# Patient Record
Sex: Female | Born: 1990 | Race: Black or African American | Hispanic: No | Marital: Single | Smoking: Former smoker
Health system: Southern US, Community
[De-identification: ages and names within clinical notes are randomized; demographics above are authoritative.]

## PROBLEM LIST (undated history)

## (undated) DIAGNOSIS — O09219 Supervision of pregnancy with history of pre-term labor, unspecified trimester: Secondary | ICD-10-CM

## (undated) HISTORY — DX: Supervision of pregnancy with history of pre-term labor, unspecified trimester: O09.219

## (undated) HISTORY — PX: NO PAST SURGERIES: SHX2092

---

## 2015-04-14 DIAGNOSIS — Z975 Presence of (intrauterine) contraceptive device: Secondary | ICD-10-CM | POA: Insufficient documentation

## 2015-04-14 DIAGNOSIS — O42111 Preterm premature rupture of membranes, onset of labor more than 24 hours following rupture, first trimester: Secondary | ICD-10-CM

## 2015-04-14 DIAGNOSIS — O09899 Supervision of other high risk pregnancies, unspecified trimester: Secondary | ICD-10-CM

## 2015-04-14 HISTORY — DX: Supervision of other high risk pregnancies, unspecified trimester: O09.899

## 2017-03-03 NOTE — L&D Delivery Note (Signed)
Patient: Debra Malone MRN: [id number]  GBS status: negative  Patient is a 27 y.o. now [id number] s/p NSVD at 7461w3d, who was admitted for preterm labor, s/p BMZ x1 yesterday at 12:20. Progressed on her own. SROM 0h 7330m prior to delivery with clear fluid.   Delivery Note At 10:28 AM a viable female was delivered via Vaginal, Spontaneous (Presentation: vertex; LOA  ).  APGAR: 9, 9; weight pending  Placenta status: intact, bilobed.  Cord:  3-vessel with the following complications: loose nuchal cord  Anesthesia:  Epidural Episiotomy: None Lacerations: Labial Suture Repair: 4.0 Monocryl Est. Blood Loss (mL):    Mom to postpartum.  Baby to Couplet care / Skin to Skin.  Raynelle FanningJulie P. Degele, MD OB Fellow 08/03/17, 10:48 AM

## 2017-03-30 LAB — OB RESULTS CONSOLE RUBELLA ANTIBODY, IGM: Rubella: IMMUNE

## 2017-03-30 LAB — OB RESULTS CONSOLE HEPATITIS B SURFACE ANTIGEN: Hepatitis B Surface Ag: NEGATIVE

## 2017-04-08 ENCOUNTER — Encounter: Payer: Self-pay | Admitting: Obstetrics & Gynecology

## 2017-04-08 ENCOUNTER — Ambulatory Visit (INDEPENDENT_AMBULATORY_CARE_PROVIDER_SITE_OTHER): Payer: Medicaid Other | Admitting: Obstetrics & Gynecology

## 2017-04-08 DIAGNOSIS — Z3482 Encounter for supervision of other normal pregnancy, second trimester: Secondary | ICD-10-CM

## 2017-04-08 DIAGNOSIS — Z3481 Encounter for supervision of other normal pregnancy, first trimester: Secondary | ICD-10-CM | POA: Diagnosis not present

## 2017-04-08 DIAGNOSIS — O09219 Supervision of pregnancy with history of pre-term labor, unspecified trimester: Secondary | ICD-10-CM

## 2017-04-08 DIAGNOSIS — Z3490 Encounter for supervision of normal pregnancy, unspecified, unspecified trimester: Secondary | ICD-10-CM

## 2017-04-08 DIAGNOSIS — O0992 Supervision of high risk pregnancy, unspecified, second trimester: Secondary | ICD-10-CM

## 2017-04-08 DIAGNOSIS — O09899 Supervision of other high risk pregnancies, unspecified trimester: Secondary | ICD-10-CM | POA: Insufficient documentation

## 2017-04-08 DIAGNOSIS — O09212 Supervision of pregnancy with history of pre-term labor, second trimester: Secondary | ICD-10-CM

## 2017-04-08 DIAGNOSIS — O0990 Supervision of high risk pregnancy, unspecified, unspecified trimester: Secondary | ICD-10-CM | POA: Insufficient documentation

## 2017-04-08 MED ORDER — HYDROXYPROGESTERONE CAPROATE 275 MG/1.1ML ~~LOC~~ SOAJ
275.0000 mg | Freq: Once | SUBCUTANEOUS | Status: AC
  Administered 2017-04-13: 275 mg via SUBCUTANEOUS

## 2017-04-08 NOTE — Progress Notes (Signed)
  [facility]    Debra Dora SimsM Faulconer is a [id number] 4258w5d being seen today for her first obstetrical visit.  Her obstetrical history is significant for h/o 21 week loss and 30 week PTB. Patient does intend to breast feed. Pregnancy history fully reviewed.  Patient reports no complaints.  Vitals:   04/08/17 1310 04/08/17 1311  BP: 117/72   Pulse: 85   Weight: 143 lb 11.2 oz (65.2 kg)   Height:  5\' 4"  (1.626 m)    HISTORY: OB History  Gravida Para Term Preterm AB Living  4 2   2 1 1   SAB TAB Ectopic Multiple Live Births  1       2    # Outcome Date GA Lbr Len/2nd Weight Sex Delivery Anes PTL Lv  4 Current           3 Preterm 04/14/15 1270w0d    Vag-Spont   LIV  2 Preterm 07/22/13     Vag-Spont   ND  1 SAB              Past Medical History:  Diagnosis Date  . History of preterm delivery, currently pregnant 04/14/2015   30 weeks   History reviewed. No pertinent surgical history. History reviewed. No pertinent family history.   Exam    Uterus:   21  Pelvic Exam:                                    Skin: normal coloration and turgor, no rashes    Neurologic: oriented, normal mood   Extremities: normal strength, tone, and muscle mass   HEENT PERRLA and neck supple with midline trachea   Mouth/Teeth dental hygiene good   Neck supple   Cardiovascular: regular rate and rhythm   Respiratory:  appears well, vitals normal, no respiratory distress, acyanotic, normal RR   Abdomen: gravid NT   Urinary:        Assessment:    Pregnancy: [id number] Patient Active Problem List   Diagnosis Date Noted  . Supervision of high risk pregnancy, antepartum 04/08/2017  . History of preterm delivery, currently pregnant 04/08/2017        Plan:     Initial labs drawn. Prenatal vitamins. Problem list reviewed and updated. Genetic Screening discussed  MaterniT21 normal  Ultrasound discussed; fetal survey: ordered.  Follow up in 4 weeks. 50% of 30 min  visit spent on counseling and coordination of care.  Continue Makena injection weekly   Scheryl DarterJames Omero Kowal 04/08/2017

## 2017-04-08 NOTE — Patient Instructions (Addendum)
Second Trimester of Pregnancy The second trimester is from week 13 through week 28, month 4 through 6. This is often the time in pregnancy that you feel your best. Often times, morning sickness has lessened or quit. You may have more energy, and you may get hungry more often. Your unborn baby (fetus) is growing rapidly. At the end of the sixth month, he or she is about 9 inches long and weighs about 1 pounds. You will likely feel the baby move (quickening) between 18 and 20 weeks of pregnancy. Follow these instructions at home:  Avoid all smoking, herbs, and alcohol. Avoid drugs not approved by your doctor.  Do not use any tobacco products, including cigarettes, chewing tobacco, and electronic cigarettes. If you need help quitting, ask your doctor. You may get counseling or other support to help you quit.  Only take medicine as told by your doctor. Some medicines are safe and some are not during pregnancy.  Exercise only as told by your doctor. Stop exercising if you start having cramps.  Eat regular, healthy meals.  Wear a good support bra if your breasts are tender.  Do not use hot tubs, steam rooms, or saunas.  Wear your seat belt when driving.  Avoid raw meat, uncooked cheese, and liter boxes and soil used by cats.  Take your prenatal vitamins.  Take 1500-2000 milligrams of calcium daily starting at the 20th week of pregnancy until you deliver your baby.  Try taking medicine that helps you poop (stool softener) as needed, and if your doctor approves. Eat more fiber by eating fresh fruit, vegetables, and whole grains. Drink enough fluids to keep your pee (urine) clear or pale yellow.  Take warm water baths (sitz baths) to soothe pain or discomfort caused by hemorrhoids. Use hemorrhoid cream if your doctor approves.  If you have puffy, bulging veins (varicose veins), wear support hose. Raise (elevate) your feet for 15 minutes, 3-4 times a day. Limit salt in your diet.  Avoid heavy  lifting, wear low heals, and sit up straight.  Rest with your legs raised if you have leg cramps or low back pain.  Visit your dentist if you have not gone during your pregnancy. Use a soft toothbrush to brush your teeth. Be gentle when you floss.  You can have sex (intercourse) unless your doctor tells you not to.  Go to your doctor visits. Get help if:  You feel dizzy.  You have mild cramps or pressure in your lower belly (abdomen).  You have a nagging pain in your belly area.  You continue to feel sick to your stomach (nauseous), throw up (vomit), or have watery poop (diarrhea).  You have bad smelling fluid coming from your vagina.  You have pain with peeing (urination). Get help right away if:  You have a fever.  You are leaking fluid from your vagina.  You have spotting or bleeding from your vagina.  You have severe belly cramping or pain.  You lose or gain weight rapidly.  You have trouble catching your breath and have chest pain.  You notice sudden or extreme puffiness (swelling) of your face, hands, ankles, feet, or legs.  You have not felt the baby move in over an hour.  You have severe headaches that do not go away with medicine.  You have vision changes. This information is not intended to replace advice given to you by your health care provider. Make sure you discuss any questions you have with your health care   provider. Document Released: 05/14/2009 Document Revised: 07/26/2015 Document Reviewed: 04/20/2012 Elsevier Interactive Patient Education  2017 Elsevier Inc.  

## 2017-04-10 ENCOUNTER — Other Ambulatory Visit: Payer: Self-pay | Admitting: Obstetrics & Gynecology

## 2017-04-10 ENCOUNTER — Ambulatory Visit (HOSPITAL_COMMUNITY)
Admission: RE | Admit: 2017-04-10 | Discharge: 2017-04-10 | Disposition: A | Discharge status: Home / Self Care | Payer: Medicaid Other | Source: Ambulatory Visit | Attending: Obstetrics & Gynecology | Admitting: Obstetrics & Gynecology

## 2017-04-10 ENCOUNTER — Other Ambulatory Visit (HOSPITAL_COMMUNITY): Payer: Self-pay | Admitting: *Deleted

## 2017-04-10 ENCOUNTER — Encounter (HOSPITAL_COMMUNITY): Payer: Self-pay

## 2017-04-10 DIAGNOSIS — O09219 Supervision of pregnancy with history of pre-term labor, unspecified trimester: Secondary | ICD-10-CM | POA: Insufficient documentation

## 2017-04-10 DIAGNOSIS — O09899 Supervision of other high risk pregnancies, unspecified trimester: Secondary | ICD-10-CM

## 2017-04-10 DIAGNOSIS — Z3689 Encounter for other specified antenatal screening: Secondary | ICD-10-CM

## 2017-04-10 DIAGNOSIS — Z3A20 20 weeks gestation of pregnancy: Secondary | ICD-10-CM

## 2017-04-10 DIAGNOSIS — Z3490 Encounter for supervision of normal pregnancy, unspecified, unspecified trimester: Secondary | ICD-10-CM

## 2017-04-10 DIAGNOSIS — O0990 Supervision of high risk pregnancy, unspecified, unspecified trimester: Secondary | ICD-10-CM

## 2017-04-10 DIAGNOSIS — O09299 Supervision of pregnancy with other poor reproductive or obstetric history, unspecified trimester: Secondary | ICD-10-CM | POA: Diagnosis not present

## 2017-04-13 ENCOUNTER — Ambulatory Visit: Payer: Medicaid Other

## 2017-04-13 ENCOUNTER — Encounter: Payer: Self-pay | Admitting: Obstetrics & Gynecology

## 2017-04-13 VITALS — BP 124/75 | HR 79

## 2017-04-13 DIAGNOSIS — Z8751 Personal history of pre-term labor: Secondary | ICD-10-CM

## 2017-04-13 NOTE — Progress Notes (Signed)
Pt is in the office for 17-p injection, administered and pt tolerated well .Marland Kitchen. Administrations This Visit    HYDROXYprogesterone Caproate SOAJ 275 mg    Admin Date 04/13/2017 Action Given Dose 275 mg Route Subcutaneous Administered By Katrina StackStalling, Brittany D, RN

## 2017-04-15 ENCOUNTER — Encounter: Payer: Self-pay | Admitting: Family Medicine

## 2017-04-15 DIAGNOSIS — O0990 Supervision of high risk pregnancy, unspecified, unspecified trimester: Secondary | ICD-10-CM

## 2017-04-20 ENCOUNTER — Ambulatory Visit (INDEPENDENT_AMBULATORY_CARE_PROVIDER_SITE_OTHER): Payer: Medicaid Other | Admitting: Pediatrics

## 2017-04-20 VITALS — BP 111/73 | HR 91 | Wt 143.0 lb

## 2017-04-20 DIAGNOSIS — O09212 Supervision of pregnancy with history of pre-term labor, second trimester: Secondary | ICD-10-CM

## 2017-04-20 DIAGNOSIS — Z8751 Personal history of pre-term labor: Secondary | ICD-10-CM

## 2017-04-20 MED ORDER — HYDROXYPROGESTERONE CAPROATE 275 MG/1.1ML ~~LOC~~ SOAJ
275.0000 mg | Freq: Once | SUBCUTANEOUS | Status: AC
  Administered 2017-04-20: 275 mg via SUBCUTANEOUS

## 2017-04-20 NOTE — Progress Notes (Signed)
I have reviewed this chart and agree with the RN/CMA assessment and management.    K. Meryl Davis, M.D. Attending Obstetrician & Gynecologist, [facility], [facility]  

## 2017-04-27 ENCOUNTER — Other Ambulatory Visit (HOSPITAL_COMMUNITY)
Admission: RE | Admit: 2017-04-27 | Discharge: 2017-04-27 | Disposition: A | Discharge status: Home / Self Care | Payer: Medicaid Other | Source: Ambulatory Visit | Attending: Obstetrics | Admitting: Obstetrics

## 2017-04-27 ENCOUNTER — Ambulatory Visit: Payer: Medicaid Other

## 2017-04-27 DIAGNOSIS — N898 Other specified noninflammatory disorders of vagina: Secondary | ICD-10-CM

## 2017-04-27 DIAGNOSIS — O26899 Other specified pregnancy related conditions, unspecified trimester: Secondary | ICD-10-CM | POA: Insufficient documentation

## 2017-04-27 DIAGNOSIS — O09899 Supervision of other high risk pregnancies, unspecified trimester: Secondary | ICD-10-CM

## 2017-04-27 DIAGNOSIS — O09219 Supervision of pregnancy with history of pre-term labor, unspecified trimester: Principal | ICD-10-CM

## 2017-04-27 MED ORDER — HYDROXYPROGESTERONE CAPROATE 275 MG/1.1ML ~~LOC~~ SOAJ
275.0000 mg | Freq: Once | SUBCUTANEOUS | Status: AC
  Administered 2017-04-27: 275 mg via SUBCUTANEOUS

## 2017-04-27 NOTE — Progress Notes (Signed)
17 P given in Left Deltoid w/o any problems  Pt instructed how to self swab

## 2017-04-27 NOTE — Progress Notes (Signed)
I have reviewed the chart and agree with nursing staff's documentation of this patient's encounter.  Catalina AntiguaPeggy Elmire Amrein, MD 04/27/2017 2:43 PM

## 2017-04-28 LAB — CERVICOVAGINAL ANCILLARY ONLY
Bacterial vaginitis: NEGATIVE
Candida vaginitis: POSITIVE — AB
Chlamydia: NEGATIVE
Neisseria Gonorrhea: NEGATIVE
Trichomonas: NEGATIVE

## 2017-04-29 ENCOUNTER — Telehealth: Payer: Self-pay

## 2017-04-29 ENCOUNTER — Other Ambulatory Visit: Payer: Self-pay | Admitting: Obstetrics and Gynecology

## 2017-04-29 MED ORDER — TERCONAZOLE 0.8 % VA CREA: 1.0000 | TOPICAL_CREAM | Freq: Every day | VAGINAL | 0 refills | Status: DC

## 2017-04-29 NOTE — Telephone Encounter (Signed)
Patient notified of results and Rx; also encouraged her to sign up for MyChart. I resent Activation code.

## 2017-04-29 NOTE — Telephone Encounter (Signed)
-----   Message from Catalina AntiguaPeggy Constant, MD sent at 04/29/2017  8:35 AM EST ----- Please inform patient of yeast infection. Rx has been provided

## 2017-05-04 ENCOUNTER — Encounter: Payer: Self-pay | Admitting: Obstetrics and Gynecology

## 2017-05-04 ENCOUNTER — Ambulatory Visit (INDEPENDENT_AMBULATORY_CARE_PROVIDER_SITE_OTHER): Payer: Medicaid Other | Admitting: Obstetrics and Gynecology

## 2017-05-04 VITALS — BP 129/72 | HR 98 | Wt 144.5 lb

## 2017-05-04 DIAGNOSIS — O0992 Supervision of high risk pregnancy, unspecified, second trimester: Secondary | ICD-10-CM

## 2017-05-04 DIAGNOSIS — O09212 Supervision of pregnancy with history of pre-term labor, second trimester: Secondary | ICD-10-CM | POA: Diagnosis not present

## 2017-05-04 DIAGNOSIS — O0990 Supervision of high risk pregnancy, unspecified, unspecified trimester: Secondary | ICD-10-CM

## 2017-05-04 DIAGNOSIS — O09219 Supervision of pregnancy with history of pre-term labor, unspecified trimester: Secondary | ICD-10-CM

## 2017-05-04 DIAGNOSIS — Z87898 Personal history of other specified conditions: Secondary | ICD-10-CM

## 2017-05-04 DIAGNOSIS — F1911 Other psychoactive substance abuse, in remission: Secondary | ICD-10-CM

## 2017-05-04 DIAGNOSIS — O09899 Supervision of other high risk pregnancies, unspecified trimester: Secondary | ICD-10-CM

## 2017-05-04 MED ORDER — HYDROXYPROGESTERONE CAPROATE 275 MG/1.1ML ~~LOC~~ SOAJ
275.0000 mg | Freq: Once | SUBCUTANEOUS | Status: AC
  Administered 2017-05-04: 275 mg via SUBCUTANEOUS

## 2017-05-04 NOTE — Patient Instructions (Signed)
DTaP Vaccine (Diphtheria, Tetanus, and Pertussis): What You Need to Know 1. Why get vaccinated? Diphtheria, tetanus, and pertussis are serious diseases caused by bacteria. Diphtheria and pertussis are spread from person to person. Tetanus enters the body through cuts or wounds. DIPHTHERIA causes a thick covering in the back of the throat.  It can lead to breathing problems, paralysis, heart failure, and even death.  TETANUS (Lockjaw) causes painful tightening of the muscles, usually all over the body.  It can lead to "locking" of the jaw so the victim cannot open his mouth or swallow. Tetanus leads to death in up to 2 out of 10 cases.  PERTUSSIS (Whooping Cough) causes coughing spells so bad that it is hard for infants to eat, drink, or breathe. These spells can last for weeks.  It can lead to pneumonia, seizures (jerking and staring spells), brain damage, and death.  Diphtheria, tetanus, and pertussis vaccine (DTaP) can help prevent these diseases. Most children who are vaccinated with DTaP will be protected throughout childhood. Many more children would get these diseases if we stopped vaccinating. DTaP is a safer version of an older vaccine called DTP. DTP is no longer used in the [place]. 2. Who should get DTaP vaccine and when? Children should get 5 doses of DTaP vaccine, one dose at each of the following ages:  2 months  4 months  6 months  15-18 months  4-6 years  DTaP may be given at the same time as other vaccines. 3. Some children should not get DTaP vaccine or should wait  Children with minor illnesses, such as a cold, may be vaccinated. But children who are moderately or severely ill should usually wait until they recover before getting DTaP vaccine.  Any child who had a life-threatening allergic reaction after a dose of DTaP should not get another dose.  Any child who suffered a brain or nervous system disease within 7 days after a dose of DTaP should not get  another dose.  Talk with your doctor if your child: ? had a seizure or collapsed after a dose of DTaP, ? cried non-stop for 3 hours or more after a dose of DTaP, ? had a fever over 105F after a dose of DTaP. Ask your doctor for more information. Some of these children should not get another dose of pertussis vaccine, but may get a vaccine without pertussis, called DT. 4. Older children and adults DTaP is not licensed for adolescents, adults, or children 7 years of age and older. But older people still need protection. A vaccine called Tdap is similar to DTaP. A single dose of Tdap is recommended for people 11 through 25 years of age. Another vaccine, called Td, protects against tetanus and diphtheria, but not pertussis. It is recommended every 10 years. There are separate Vaccine Information Statements for these vaccines. 5. What are the risks from DTaP vaccine? Getting diphtheria, tetanus, or pertussis disease is much riskier than getting DTaP vaccine. However, a vaccine, like any medicine, is capable of causing serious problems, such as severe allergic reactions. The risk of DTaP vaccine causing serious harm, or death, is extremely small. Mild problems (common)  Fever (up to about 1 child in 4)  Redness or swelling where the shot was given (up to about 1 child in 4)  Soreness or tenderness where the shot was given (up to about 1 child in 4) These problems occur more often after the 4th and 5th doses of the DTaP series than after   earlier doses. Sometimes the 4th or 5th dose of DTaP vaccine is followed by swelling of the entire arm or leg in which the shot was given, lasting 1-7 days (up to about 1 child in 30). Other mild problems include:  Fussiness (up to about 1 child in 3)  Tiredness or poor appetite (up to about 1 child in 10)  Vomiting (up to about 1 child in 50) These problems generally occur 1-3 days after the shot. Moderate problems (uncommon)  Seizure (jerking or staring)  (about 1 child out of 14,000)  Non-stop crying, for 3 hours or more (up to about 1 child out of 1,000)  High fever, over 105F (about 1 child out of 16,000) Severe problems (very rare)  Serious allergic reaction (less than 1 out of a million doses)  Several other severe problems have been reported after DTaP vaccine. These include: ? Long-term seizures, coma, or lowered consciousness ? Permanent brain damage. These are so rare it is hard to tell if they are caused by the vaccine. Controlling fever is especially important for children who have had seizures, for any reason. It is also important if another family member has had seizures. You can reduce fever and pain by giving your child an aspirin-free pain reliever when the shot is given, and for the next 24 hours, following the package instructions. 6. What if there is a serious reaction? What should I look for? Look for anything that concerns you, such as signs of a severe allergic reaction, very high fever, or behavior changes. Signs of a severe allergic reaction can include hives, swelling of the face and throat, difficulty breathing, a fast heartbeat, dizziness, and weakness. These would start a few minutes to a few hours after the vaccination. What should I do?  If you think it is a severe allergic reaction or other emergency that can't wait, call 9-1-1 or get the person to the nearest hospital. Otherwise, call your doctor.  Afterward, the reaction should be reported to the Vaccine Adverse Event Reporting System (VAERS). Your doctor might file this report, or you can do it yourself through the VAERS web site at www.vaers.hhs.gov, or by calling 1-800-822-7967. ? VAERS is only for reporting reactions. They do not give medical advice. 7. The National Vaccine Injury Compensation Program The National Vaccine Injury Compensation Program (VICP) is a federal program that was created to compensate people who may have been injured by certain  vaccines. Persons who believe they may have been injured by a vaccine can learn about the program and about filing a claim by calling 1-800-338-2382 or visiting the VICP website at www.hrsa.gov/vaccinecompensation. 8. How can I learn more?  Ask your doctor.  Call your local or state health department.  Contact the Centers for Disease Control and Prevention (CDC): ? Call 1-800-232-4636 (1-800-CDC-INFO) or ? Visit CDC's website at www.cdc.gov/vaccines CDC DTaP Vaccine (Diphtheria, Tetanus, and Pertussis) VIS (07/17/05) This information is not intended to replace advice given to you by your health care provider. Make sure you discuss any questions you have with your health care provider. Document Released: 12/15/2005 Document Revised: 11/08/2015 Document Reviewed: 11/08/2015 Elsevier Interactive Patient Education  2017 Elsevier Inc.   Contraception Choices Contraception, also called birth control, refers to methods or devices that prevent pregnancy. Hormonal methods Contraceptive implant A contraceptive implant is a thin, plastic tube that contains a hormone. It is inserted into the upper part of the arm. It can remain in place for up to 3 years. Progestin-only injections Progestin-only   injections are injections of progestin, a synthetic form of the hormone progesterone. They are given every 3 months by a health care provider. Birth control pills Birth control pills are pills that contain hormones that prevent pregnancy. They must be taken once a day, preferably at the same time each day. Birth control patch The birth control patch contains hormones that prevent pregnancy. It is placed on the skin and must be changed once a week for three weeks and removed on the fourth week. A prescription is needed to use this method of contraception. Vaginal ring A vaginal ring contains hormones that prevent pregnancy. It is placed in the vagina for three weeks and removed on the fourth week. After that,  the process is repeated with a new ring. A prescription is needed to use this method of contraception. Emergency contraceptive Emergency contraceptives prevent pregnancy after unprotected sex. They come in pill form and can be taken up to 5 days after sex. They work best the sooner they are taken after having sex. Most emergency contraceptives are available without a prescription. This method should not be used as your only form of birth control. Barrier methods Female condom A female condom is a thin sheath that is worn over the penis during sex. Condoms keep sperm from going inside a woman's body. They can be used with a spermicide to increase their effectiveness. They should be disposed after a single use. Female condom A female condom is a soft, loose-fitting sheath that is put into the vagina before sex. The condom keeps sperm from going inside a woman's body. They should be disposed after a single use. Diaphragm A diaphragm is a soft, dome-shaped barrier. It is inserted into the vagina before sex, along with a spermicide. The diaphragm blocks sperm from entering the uterus, and the spermicide kills sperm. A diaphragm should be left in the vagina for 6-8 hours after sex and removed within 24 hours. A diaphragm is prescribed and fitted by a health care provider. A diaphragm should be replaced every 1-2 years, after giving birth, after gaining more than 15 lb (6.8 kg), and after pelvic surgery. Cervical cap A cervical cap is a round, soft latex or plastic cup that fits over the cervix. It is inserted into the vagina before sex, along with spermicide. It blocks sperm from entering the uterus. The cap should be left in place for 6-8 hours after sex and removed within 48 hours. A cervical cap must be prescribed and fitted by a health care provider. It should be replaced every 2 years. Sponge A sponge is a soft, circular piece of polyurethane foam with spermicide on it. The sponge helps block sperm from  entering the uterus, and the spermicide kills sperm. To use it, you make it wet and then insert it into the vagina. It should be inserted before sex, left in for at least 6 hours after sex, and removed and thrown away within 30 hours. Spermicides Spermicides are chemicals that kill or block sperm from entering the cervix and uterus. They can come as a cream, jelly, suppository, foam, or tablet. A spermicide should be inserted into the vagina with an applicator at least 10-15 minutes before sex to allow time for it to work. The process must be repeated every time you have sex. Spermicides do not require a prescription. Intrauterine contraception Intrauterine device (IUD) An IUD is a T-shaped device that is put in a woman's uterus. There are two types:  Hormone IUD.This type contains progestin, a   synthetic form of the hormone progesterone. This type can stay in place for 3-5 years.  Copper IUD.This type is wrapped in copper wire. It can stay in place for 10 years.  Permanent methods of contraception Female tubal ligation In this method, a woman's fallopian tubes are sealed, tied, or blocked during surgery to prevent eggs from traveling to the uterus. Hysteroscopic sterilization In this method, a small, flexible insert is placed into each fallopian tube. The inserts cause scar tissue to form in the fallopian tubes and block them, so sperm cannot reach an egg. The procedure takes about 3 months to be effective. Another form of birth control must be used during those 3 months. Female sterilization This is a procedure to tie off the tubes that carry sperm (vasectomy). After the procedure, the man can still ejaculate fluid (semen). Natural planning methods Natural family planning In this method, a couple does not have sex on days when the woman could become pregnant. Calendar method This means keeping track of the length of each menstrual cycle, identifying the days when pregnancy can happen, and not  having sex on those days. Ovulation method In this method, a couple avoids sex during ovulation. Symptothermal method This method involves not having sex during ovulation. The woman typically checks for ovulation by watching changes in her temperature and in the consistency of cervical mucus. Post-ovulation method In this method, a couple waits to have sex until after ovulation. Summary  Contraception, also called birth control, means methods or devices that prevent pregnancy.  Hormonal methods of contraception include implants, injections, pills, patches, vaginal rings, and emergency contraceptives.  Barrier methods of contraception can include female condoms, female condoms, diaphragms, cervical caps, sponges, and spermicides.  There are two types of IUDs (intrauterine devices). An IUD can be put in a woman's uterus to prevent pregnancy for 3-5 years.  Permanent sterilization can be done through a procedure for males, females, or both.  Natural family planning methods involve not having sex on days when the woman could become pregnant. This information is not intended to replace advice given to you by your health care provider. Make sure you discuss any questions you have with your health care provider. Document Released: 02/17/2005 Document Revised: 03/22/2016 Document Reviewed: 03/22/2016 Elsevier Interactive Patient Education  2018 Elsevier Inc.  

## 2017-05-04 NOTE — Progress Notes (Signed)
   PRENATAL VISIT NOTE  Subjective:  Debra Malone is a 27 y.o. [phone] at 530w3d being seen today for ongoing prenatal care.  She is currently monitored for the following issues for this high-risk pregnancy and has Supervision of high risk pregnancy, antepartum; History of preterm delivery, currently pregnant; and History of substance abuse on their problem list.  Patient reports no complaints.  Contractions: Not present. Vag. Bleeding: None.  Movement: Present. Denies leaking of fluid.   The following portions of the patient's history were reviewed and updated as appropriate: allergies, current medications, past family history, past medical history, past social history, past surgical history and problem list. Problem list updated.  Objective:   Vitals:   05/04/17 1435  BP: 129/72  Pulse: 98  Weight: 144 lb 8 oz (65.5 kg)    Fetal Status: Fetal Heart Rate (bpm): 160 Fundal Height: 24 cm Movement: Present     General:  Alert, oriented and cooperative. Patient is in no acute distress.  Skin: Skin is warm and dry. No rash noted.   Cardiovascular: Normal heart rate noted  Respiratory: Normal respiratory effort, no problems with respiration noted  Abdomen: Soft, gravid, appropriate for gestational age.  Pain/Pressure: Absent     Pelvic: Cervical exam deferred        Extremities: Normal range of motion.  Edema: None  Mental Status:  Normal mood and affect. Normal behavior. Normal judgment and thought content.   Assessment and Plan:  Pregnancy: [id number] at 4030w3d  1. Supervision of high risk pregnancy, antepartum Patient is doing well Follow up anatomy ultrasound on 3/8 Third trimester labs next visit with glucola and tdap Patient is undecided on contraception and pediatrician  2. History of preterm delivery, currently pregnant Continue weekly 17-P - HYDROXYprogesterone Caproate SOAJ 275 mg  3. History of substance abuse   Preterm labor symptoms and general obstetric  precautions including but not limited to vaginal bleeding, contractions, leaking of fluid and fetal movement were reviewed in detail with the patient. Please refer to After Visit Summary for other counseling recommendations.  Return in about 4 weeks (around 06/01/2017) for ROB, weekly for 17-P, 2 hr glucola next visit.   Catalina AntiguaPeggy Hailynn Slovacek, MD

## 2017-05-07 ENCOUNTER — Encounter (HOSPITAL_COMMUNITY): Payer: Self-pay

## 2017-05-08 ENCOUNTER — Ambulatory Visit (HOSPITAL_COMMUNITY)
Admission: RE | Admit: 2017-05-08 | Discharge: 2017-05-08 | Disposition: A | Discharge status: Home / Self Care | Payer: Medicaid Other | Source: Ambulatory Visit | Attending: Obstetrics & Gynecology | Admitting: Obstetrics & Gynecology

## 2017-05-08 ENCOUNTER — Other Ambulatory Visit (HOSPITAL_COMMUNITY): Payer: Self-pay | Admitting: Obstetrics and Gynecology

## 2017-05-08 ENCOUNTER — Encounter (HOSPITAL_COMMUNITY): Payer: Self-pay

## 2017-05-08 DIAGNOSIS — O09212 Supervision of pregnancy with history of pre-term labor, second trimester: Secondary | ICD-10-CM

## 2017-05-08 DIAGNOSIS — Z3A24 24 weeks gestation of pregnancy: Secondary | ICD-10-CM | POA: Diagnosis not present

## 2017-05-08 DIAGNOSIS — Z362 Encounter for other antenatal screening follow-up: Secondary | ICD-10-CM

## 2017-05-08 DIAGNOSIS — O09292 Supervision of pregnancy with other poor reproductive or obstetric history, second trimester: Secondary | ICD-10-CM | POA: Diagnosis not present

## 2017-05-08 DIAGNOSIS — O0990 Supervision of high risk pregnancy, unspecified, unspecified trimester: Secondary | ICD-10-CM | POA: Diagnosis present

## 2017-05-08 DIAGNOSIS — F1911 Other psychoactive substance abuse, in remission: Secondary | ICD-10-CM

## 2017-05-11 ENCOUNTER — Ambulatory Visit (INDEPENDENT_AMBULATORY_CARE_PROVIDER_SITE_OTHER): Payer: Medicaid Other

## 2017-05-11 DIAGNOSIS — O09899 Supervision of other high risk pregnancies, unspecified trimester: Secondary | ICD-10-CM

## 2017-05-11 DIAGNOSIS — O09212 Supervision of pregnancy with history of pre-term labor, second trimester: Secondary | ICD-10-CM

## 2017-05-11 DIAGNOSIS — O09219 Supervision of pregnancy with history of pre-term labor, unspecified trimester: Principal | ICD-10-CM

## 2017-05-11 MED ORDER — HYDROXYPROGESTERONE CAPROATE 250 MG/ML IM OIL
250.0000 mg | TOPICAL_OIL | Freq: Once | INTRAMUSCULAR | Status: AC
  Administered 2017-05-11: 250 mg via INTRAMUSCULAR

## 2017-05-11 NOTE — Progress Notes (Signed)
I have reviewed the chart and agree with nursing staff's documentation of this patient's encounter.  Debra AntiguaPeggy Copeland Lapier, MD 05/11/2017 5:09 PM

## 2017-05-11 NOTE — Progress Notes (Signed)
[facility]. Pt's supply rf was called in [place]/27/17 [place] TC to pharmacist "Marchelle Folksmanda" at [facility] states meds were shipped 04/27/17; however, we looked but didn't see any in our stock today. Used office supply

## 2017-05-18 ENCOUNTER — Ambulatory Visit (INDEPENDENT_AMBULATORY_CARE_PROVIDER_SITE_OTHER): Payer: Medicaid Other

## 2017-05-18 DIAGNOSIS — Z8751 Personal history of pre-term labor: Secondary | ICD-10-CM

## 2017-05-18 DIAGNOSIS — O09212 Supervision of pregnancy with history of pre-term labor, second trimester: Secondary | ICD-10-CM | POA: Diagnosis not present

## 2017-05-18 MED ORDER — HYDROXYPROGESTERONE CAPROATE 250 MG/ML IM OIL: 250.0000 mg | TOPICAL_OIL | Freq: Once | INTRAMUSCULAR | Status: DC

## 2017-05-18 NOTE — Progress Notes (Signed)
Nurse visit for office supplied 17p given L upper outer quad w/o complaint.

## 2017-05-25 ENCOUNTER — Ambulatory Visit: Payer: Medicaid Other

## 2017-05-27 ENCOUNTER — Ambulatory Visit (INDEPENDENT_AMBULATORY_CARE_PROVIDER_SITE_OTHER): Payer: Medicaid Other

## 2017-05-27 DIAGNOSIS — Z8751 Personal history of pre-term labor: Secondary | ICD-10-CM

## 2017-05-27 DIAGNOSIS — O09212 Supervision of pregnancy with history of pre-term labor, second trimester: Secondary | ICD-10-CM

## 2017-05-27 MED ORDER — HYDROXYPROGESTERONE CAPROATE 275 MG/1.1ML ~~LOC~~ SOAJ
275.0000 mg | Freq: Once | SUBCUTANEOUS | Status: AC
  Administered 2017-05-27: 275 mg via SUBCUTANEOUS

## 2017-05-27 NOTE — Progress Notes (Signed)
Nurse visit for pt supplied 17p given R arm w/o complaint.

## 2017-06-01 ENCOUNTER — Ambulatory Visit (INDEPENDENT_AMBULATORY_CARE_PROVIDER_SITE_OTHER): Payer: Medicaid Other | Admitting: Obstetrics and Gynecology

## 2017-06-01 ENCOUNTER — Other Ambulatory Visit: Payer: Medicaid Other

## 2017-06-01 ENCOUNTER — Encounter: Payer: Self-pay | Admitting: Obstetrics and Gynecology

## 2017-06-01 VITALS — BP 120/76 | HR 99 | Wt 150.0 lb

## 2017-06-01 DIAGNOSIS — O09219 Supervision of pregnancy with history of pre-term labor, unspecified trimester: Principal | ICD-10-CM

## 2017-06-01 DIAGNOSIS — O0990 Supervision of high risk pregnancy, unspecified, unspecified trimester: Secondary | ICD-10-CM

## 2017-06-01 DIAGNOSIS — F1911 Other psychoactive substance abuse, in remission: Secondary | ICD-10-CM

## 2017-06-01 DIAGNOSIS — O09899 Supervision of other high risk pregnancies, unspecified trimester: Secondary | ICD-10-CM

## 2017-06-01 DIAGNOSIS — O09212 Supervision of pregnancy with history of pre-term labor, second trimester: Secondary | ICD-10-CM | POA: Diagnosis not present

## 2017-06-01 MED ORDER — HYDROXYPROGESTERONE CAPROATE 275 MG/1.1ML ~~LOC~~ SOAJ
275.0000 mg | Freq: Once | SUBCUTANEOUS | Status: AC
  Administered 2017-06-01: 275 mg via SUBCUTANEOUS

## 2017-06-01 NOTE — Progress Notes (Signed)
17p injection given at today's visit.  Pt tolerated well.

## 2017-06-01 NOTE — Patient Instructions (Addendum)
Medroxyprogesterone injection [Contraceptive] What is this medicine? MEDROXYPROGESTERONE (me DROX ee proe JES te rone) contraceptive injections prevent pregnancy. They provide effective birth control for 3 months. Depo-subQ Provera 104 is also used for treating pain related to endometriosis. This medicine may be used for other purposes; ask your health care provider or pharmacist if you have questions. COMMON BRAND NAME(S): Depo-Provera, Depo-subQ Provera 104 What should I tell my health care provider before I take this medicine? They need to know if you have any of these conditions: -frequently drink alcohol -asthma -blood vessel disease or a history of a blood clot in the lungs or legs -bone disease such as osteoporosis -breast cancer -diabetes -eating disorder (anorexia nervosa or bulimia) -high blood pressure -HIV infection or AIDS -kidney disease -liver disease -mental depression -migraine -seizures (convulsions) -stroke -tobacco smoker -vaginal bleeding -an unusual or allergic reaction to medroxyprogesterone, other hormones, medicines, foods, dyes, or preservatives -pregnant or trying to get pregnant -breast-feeding How should I use this medicine? Depo-Provera Contraceptive injection is given into a muscle. Depo-subQ Provera 104 injection is given under the skin. These injections are given by a health care professional. You must not be pregnant before getting an injection. The injection is usually given during the first 5 days after the start of a menstrual period or 6 weeks after delivery of a baby. Talk to your pediatrician regarding the use of this medicine in children. Special care may be needed. These injections have been used in female children who have started having menstrual periods. Overdosage: If you think you have taken too much of this medicine contact a poison control center or emergency room at once. NOTE: This medicine is only for you. Do not share this medicine  with others. What if I miss a dose? Try not to miss a dose. You must get an injection once every 3 months to maintain birth control. If you cannot keep an appointment, call and reschedule it. If you wait longer than 13 weeks between Depo-Provera contraceptive injections or longer than 14 weeks between Depo-subQ Provera 104 injections, you could get pregnant. Use another method for birth control if you miss your appointment. You may also need a pregnancy test before receiving another injection. What may interact with this medicine? Do not take this medicine with any of the following medications: -bosentan This medicine may also interact with the following medications: -aminoglutethimide -antibiotics or medicines for infections, especially rifampin, rifabutin, rifapentine, and griseofulvin -aprepitant -barbiturate medicines such as phenobarbital or primidone -bexarotene -carbamazepine -medicines for seizures like ethotoin, felbamate, oxcarbazepine, phenytoin, topiramate -modafinil -St. John's wort This list may not describe all possible interactions. Give your health care provider a list of all the medicines, herbs, non-prescription drugs, or dietary supplements you use. Also tell them if you smoke, drink alcohol, or use illegal drugs. Some items may interact with your medicine. What should I watch for while using this medicine? This drug does not protect you against HIV infection (AIDS) or other sexually transmitted diseases. Use of this product may cause you to lose calcium from your bones. Loss of calcium may cause weak bones (osteoporosis). Only use this product for more than 2 years if other forms of birth control are not right for you. The longer you use this product for birth control the more likely you will be at risk for weak bones. Ask your health care professional how you can keep strong bones. You may have a change in bleeding pattern or irregular periods. Many females stop having    periods while taking this drug. If you have received your injections on time, your chance of being pregnant is very low. If you think you may be pregnant, see your health care professional as soon as possible. Tell your health care professional if you want to get pregnant within the next year. The effect of this medicine may last a long time after you get your last injection. What side effects may I notice from receiving this medicine? Side effects that you should report to your doctor or health care professional as soon as possible: -allergic reactions like skin rash, itching or hives, swelling of the face, lips, or tongue -breast tenderness or discharge -breathing problems -changes in vision -depression -feeling faint or lightheaded, falls -fever -pain in the abdomen, chest, groin, or leg -problems with balance, talking, walking -unusually weak or tired -yellowing of the eyes or skin Side effects that usually do not require medical attention (report to your doctor or health care professional if they continue or are bothersome): -acne -fluid retention and swelling -headache -irregular periods, spotting, or absent periods -temporary pain, itching, or skin reaction at site where injected -weight gain This list may not describe all possible side effects. Call your doctor for medical advice about side effects. You may report side effects to FDA at 1-800-FDA-1088. Where should I keep my medicine? This does not apply. The injection will be given to you by a health care professional. NOTE: This sheet is a summary. It may not cover all possible information. If you have questions about this medicine, talk to your doctor, pharmacist, or health care provider.  2018 Elsevier/Gold Standard (2008-03-10 18:37:56)   Levonorgestrel intrauterine device (IUD) What is this medicine? LEVONORGESTREL IUD (LEE voe nor jes trel) is a contraceptive (birth control) device. The device is placed inside the  uterus by a healthcare professional. It is used to prevent pregnancy. This device can also be used to treat heavy bleeding that occurs during your period. This medicine may be used for other purposes; ask your health care provider or pharmacist if you have questions. COMMON BRAND NAME(S): Kyleena, LILETTA, Mirena, Skyla What should I tell my health care provider before I take this medicine? They need to know if you have any of these conditions: -abnormal Pap smear -cancer of the breast, uterus, or cervix -diabetes -endometritis -genital or pelvic infection now or in the past -have more than one sexual partner or your partner has more than one partner -heart disease -history of an ectopic or tubal pregnancy -immune system problems -IUD in place -liver disease or tumor -problems with blood clots or take blood-thinners -seizures -use intravenous drugs -uterus of unusual shape -vaginal bleeding that has not been explained -an unusual or allergic reaction to levonorgestrel, other hormones, silicone, or polyethylene, medicines, foods, dyes, or preservatives -pregnant or trying to get pregnant -breast-feeding How should I use this medicine? This device is placed inside the uterus by a health care professional. Talk to your pediatrician regarding the use of this medicine in children. Special care may be needed. Overdosage: If you think you have taken too much of this medicine contact a poison control center or emergency room at once. NOTE: This medicine is only for you. Do not share this medicine with others. What if I miss a dose? This does not apply. Depending on the brand of device you have inserted, the device will need to be replaced every 3 to 5 years if you wish to continue using this type of birth control. What   may interact with this medicine? Do not take this medicine with any of the following medications: -amprenavir -bosentan -fosamprenavir This medicine may also interact with  the following medications: -aprepitant -armodafinil -barbiturate medicines for inducing sleep or treating seizures -bexarotene -boceprevir -griseofulvin -medicines to treat seizures like carbamazepine, ethotoin, felbamate, oxcarbazepine, phenytoin, topiramate -modafinil -pioglitazone -rifabutin -rifampin -rifapentine -some medicines to treat HIV infection like atazanavir, efavirenz, indinavir, lopinavir, nelfinavir, tipranavir, ritonavir -St. John's wort -warfarin This list may not describe all possible interactions. Give your health care provider a list of all the medicines, herbs, non-prescription drugs, or dietary supplements you use. Also tell them if you smoke, drink alcohol, or use illegal drugs. Some items may interact with your medicine. What should I watch for while using this medicine? Visit your doctor or health care professional for regular check ups. See your doctor if you or your partner has sexual contact with others, becomes HIV positive, or gets a sexual transmitted disease. This product does not protect you against HIV infection (AIDS) or other sexually transmitted diseases. You can check the placement of the IUD yourself by reaching up to the top of your vagina with clean fingers to feel the threads. Do not pull on the threads. It is a good habit to check placement after each menstrual period. Call your doctor right away if you feel more of the IUD than just the threads or if you cannot feel the threads at all. The IUD may come out by itself. You may become pregnant if the device comes out. If you notice that the IUD has come out use a backup birth control method like condoms and call your health care provider. Using tampons will not change the position of the IUD and are okay to use during your period. This IUD can be safely scanned with magnetic resonance imaging (MRI) only under specific conditions. Before you have an MRI, tell your healthcare provider that you have an  IUD in place, and which type of IUD you have in place. What side effects may I notice from receiving this medicine? Side effects that you should report to your doctor or health care professional as soon as possible: -allergic reactions like skin rash, itching or hives, swelling of the face, lips, or tongue -fever, flu-like symptoms -genital sores -high blood pressure -no menstrual period for 6 weeks during use -pain, swelling, warmth in the leg -pelvic pain or tenderness -severe or sudden headache -signs of pregnancy -stomach cramping -sudden shortness of breath -trouble with balance, talking, or walking -unusual vaginal bleeding, discharge -yellowing of the eyes or skin Side effects that usually do not require medical attention (report to your doctor or health care professional if they continue or are bothersome): -acne -breast pain -change in sex drive or performance -changes in weight -cramping, dizziness, or faintness while the device is being inserted -headache -irregular menstrual bleeding within first 3 to 6 months of use -nausea This list may not describe all possible side effects. Call your doctor for medical advice about side effects. You may report side effects to FDA at 1-800-FDA-1088. Where should I keep my medicine? This does not apply. NOTE: This sheet is a summary. It may not cover all possible information. If you have questions about this medicine, talk to your doctor, pharmacist, or health care provider.  2018 Elsevier/Gold Standard (2015-11-30 14:14:56)  

## 2017-06-01 NOTE — Progress Notes (Signed)
   PRENATAL VISIT NOTE  Subjective:  Debra Malone is a 27 y.o. [phone] at 5268w3d being seen today for ongoing prenatal care.  She is currently monitored for the following issues for this high-risk pregnancy and has Supervision of high risk pregnancy, antepartum; History of preterm delivery, currently pregnant; and History of substance abuse on their problem list.  Patient reports no complaints.  Contractions: Not present. Vag. Bleeding: None.  Movement: Present. Denies leaking of fluid.   The following portions of the patient's history were reviewed and updated as appropriate: allergies, current medications, past family history, past medical history, past social history, past surgical history and problem list. Problem list updated.  Objective:   Vitals:   06/01/17 0832  BP: 120/76  Pulse: 99  Weight: 150 lb (68 kg)    Fetal Status: Fetal Heart Rate (bpm): 160 Fundal Height: 28 cm Movement: Present     General:  Alert, oriented and cooperative. Patient is in no acute distress.  Skin: Skin is warm and dry. No rash noted.   Cardiovascular: Normal heart rate noted  Respiratory: Normal respiratory effort, no problems with respiration noted  Abdomen: Soft, gravid, appropriate for gestational age.  Pain/Pressure: Absent     Pelvic: Cervical exam deferred        Extremities: Normal range of motion.     Mental Status: Normal mood and affect. Normal behavior. Normal judgment and thought content.   Assessment and Plan:  Pregnancy: [id number] at 4868w3d  1. Supervision of high risk pregnancy, antepartum Patient is doing well without complaints Third trimester labs, glucola today - Glucose Tolerance, 2 Hours w/1 Hour - CBC - HIV antibody - RPR - HYDROXYprogesterone Caproate SOAJ 275 mg  2. History of preterm delivery, currently pregnant Continue weekly 17-P  3. History of substance abuse   Preterm labor symptoms and general obstetric precautions including but not limited to vaginal  bleeding, contractions, leaking of fluid and fetal movement were reviewed in detail with the patient. Please refer to After Visit Summary for other counseling recommendations.  Return in about 2 weeks (around 06/15/2017) for ROB.  No future appointments.  Catalina AntiguaPeggy Sullivan Jacuinde, MD

## 2017-06-02 LAB — CBC
Hematocrit: 37.4 % (ref 34.0–46.6)
Hemoglobin: 12.2 g/dL (ref 11.1–15.9)
MCH: 28.8 pg (ref 26.6–33.0)
MCHC: 32.6 g/dL (ref 31.5–35.7)
MCV: 88 fL (ref 79–97)
Platelets: 175 10*3/uL (ref 150–379)
RBC: 4.23 x10E6/uL (ref 3.77–5.28)
RDW: 14.2 % (ref 12.3–15.4)
WBC: 11.6 10*3/uL — ABNORMAL HIGH (ref 3.4–10.8)

## 2017-06-02 LAB — GLUCOSE TOLERANCE, 2 HOURS W/ 1HR
Glucose, 1 hour: 110 mg/dL (ref 65–179)
Glucose, 2 hour: 102 mg/dL (ref 65–152)
Glucose, Fasting: 70 mg/dL (ref 65–91)

## 2017-06-02 LAB — HIV ANTIBODY (ROUTINE TESTING W REFLEX): HIV Screen 4th Generation wRfx: NONREACTIVE

## 2017-06-02 LAB — RPR: RPR Ser Ql: NONREACTIVE

## 2017-06-09 ENCOUNTER — Ambulatory Visit (INDEPENDENT_AMBULATORY_CARE_PROVIDER_SITE_OTHER): Payer: Medicaid Other

## 2017-06-09 VITALS — BP 103/66 | HR 82 | Temp 99.0°F | Resp 16 | Wt 153.0 lb

## 2017-06-09 DIAGNOSIS — O0990 Supervision of high risk pregnancy, unspecified, unspecified trimester: Secondary | ICD-10-CM

## 2017-06-09 DIAGNOSIS — O09213 Supervision of pregnancy with history of pre-term labor, third trimester: Secondary | ICD-10-CM | POA: Diagnosis not present

## 2017-06-09 DIAGNOSIS — O09219 Supervision of pregnancy with history of pre-term labor, unspecified trimester: Secondary | ICD-10-CM

## 2017-06-09 DIAGNOSIS — O09899 Supervision of other high risk pregnancies, unspecified trimester: Secondary | ICD-10-CM

## 2017-06-09 MED ORDER — HYDROXYPROGESTERONE CAPROATE 275 MG/1.1ML ~~LOC~~ SOAJ
275.0000 mg | Freq: Once | SUBCUTANEOUS | Status: AC
Start: 2017-06-09 — End: 2017-06-09
  Administered 2017-06-09: 275 mg via SUBCUTANEOUS

## 2017-06-09 NOTE — Progress Notes (Signed)
HPI: Patient is here for Hydroxyprogesterone Caproate (Makena) injection. Last: 06/09/17 - Left arm SQ.   Assessment and Plan: Patient tolerated injection - Right arm SQ  - well without complications. Patient advised to schedule next injection for next week.

## 2017-06-16 ENCOUNTER — Ambulatory Visit (INDEPENDENT_AMBULATORY_CARE_PROVIDER_SITE_OTHER): Payer: Medicaid Other | Admitting: Obstetrics & Gynecology

## 2017-06-16 ENCOUNTER — Encounter: Payer: Self-pay | Admitting: Obstetrics & Gynecology

## 2017-06-16 VITALS — BP 121/75 | HR 102 | Wt 150.4 lb

## 2017-06-16 DIAGNOSIS — O0990 Supervision of high risk pregnancy, unspecified, unspecified trimester: Secondary | ICD-10-CM

## 2017-06-16 DIAGNOSIS — O09899 Supervision of other high risk pregnancies, unspecified trimester: Secondary | ICD-10-CM

## 2017-06-16 DIAGNOSIS — O09212 Supervision of pregnancy with history of pre-term labor, second trimester: Secondary | ICD-10-CM | POA: Diagnosis not present

## 2017-06-16 DIAGNOSIS — O0992 Supervision of high risk pregnancy, unspecified, second trimester: Secondary | ICD-10-CM

## 2017-06-16 DIAGNOSIS — O09219 Supervision of pregnancy with history of pre-term labor, unspecified trimester: Secondary | ICD-10-CM

## 2017-06-16 MED ORDER — HYDROXYPROGESTERONE CAPROATE 275 MG/1.1ML ~~LOC~~ SOAJ
275.0000 mg | Freq: Once | SUBCUTANEOUS | Status: AC
  Administered 2017-06-16: 275 mg via SUBCUTANEOUS

## 2017-06-16 NOTE — Patient Instructions (Signed)

## 2017-06-16 NOTE — Progress Notes (Signed)
   PRENATAL VISIT NOTE  Subjective:  Debra Malone is a 27 y.o. [phone] at 7865w4d being seen today for ongoing prenatal care.  She is currently monitored for the following issues for this high-risk pregnancy and has Supervision of high risk pregnancy, antepartum; History of preterm delivery, currently pregnant; and History of substance abuse on their problem list.  Patient reports no complaints.  Contractions: Not present. Vag. Bleeding: None.  Movement: Present. Denies leaking of fluid.   The following portions of the patient's history were reviewed and updated as appropriate: allergies, current medications, past family history, past medical history, past social history, past surgical history and problem list. Problem list updated.  Objective:   Vitals:   06/16/17 1519  BP: 121/75  Pulse: (!) 102  Weight: 150 lb 6.4 oz (68.2 kg)    Fetal Status: Fetal Heart Rate (bpm): 169   Movement: Present     General:  Alert, oriented and cooperative. Patient is in no acute distress.  Skin: Skin is warm and dry. No rash noted.   Cardiovascular: Normal heart rate noted  Respiratory: Normal respiratory effort, no problems with respiration noted  Abdomen: Soft, gravid, appropriate for gestational age.  Pain/Pressure: Absent     Pelvic: Cervical exam deferred        Extremities: Normal range of motion.     Mental Status: Normal mood and affect. Normal behavior. Normal judgment and thought content.   Assessment and Plan:  Pregnancy: [id number] at 5865w4d  1. Supervision of high risk pregnancy, antepartum H/o PTB  2. History of preterm delivery, currently pregnant  - HYDROXYprogesterone Caproate SOAJ 275 mg  Preterm labor symptoms and general obstetric precautions including but not limited to vaginal bleeding, contractions, leaking of fluid and fetal movement were reviewed in detail with the patient. Please refer to After Visit Summary for other counseling recommendations.  Return in about 3  weeks (around 07/07/2017) for 17 P weekly.  Future Appointments  Date Time Provider Department Center  06/23/2017  2:00 PM [facility] NURSE [facility] None    Scheryl DarterJames Jaymes Revels, MD

## 2017-06-23 ENCOUNTER — Ambulatory Visit: Payer: Medicaid Other

## 2017-06-23 DIAGNOSIS — O09219 Supervision of pregnancy with history of pre-term labor, unspecified trimester: Principal | ICD-10-CM

## 2017-06-23 DIAGNOSIS — O09899 Supervision of other high risk pregnancies, unspecified trimester: Secondary | ICD-10-CM

## 2017-06-23 MED ORDER — HYDROXYPROGESTERONE CAPROATE 275 MG/1.1ML ~~LOC~~ SOAJ
275.0000 mg | Freq: Once | SUBCUTANEOUS | Status: AC
  Administered 2017-06-23: 275 mg via SUBCUTANEOUS

## 2017-06-23 NOTE — Progress Notes (Signed)
I have reviewed the chart and agree with nursing staff's documentation of this patient's encounter.  Catalina AntiguaPeggy Alanson Hausmann, MD 06/23/2017 4:58 PM

## 2017-06-23 NOTE — Progress Notes (Signed)
Nurse visit for 17p given L arm w/o complaints. Took last injection from pt's supply today, called [facility] to refill & ship Makena to our office STAT.

## 2017-06-30 ENCOUNTER — Ambulatory Visit (INDEPENDENT_AMBULATORY_CARE_PROVIDER_SITE_OTHER): Payer: Medicaid Other

## 2017-06-30 VITALS — BP 120/72 | HR 98 | Wt 150.5 lb

## 2017-06-30 DIAGNOSIS — O09213 Supervision of pregnancy with history of pre-term labor, third trimester: Secondary | ICD-10-CM

## 2017-06-30 DIAGNOSIS — O09219 Supervision of pregnancy with history of pre-term labor, unspecified trimester: Principal | ICD-10-CM

## 2017-06-30 DIAGNOSIS — O09899 Supervision of other high risk pregnancies, unspecified trimester: Secondary | ICD-10-CM

## 2017-06-30 DIAGNOSIS — O0990 Supervision of high risk pregnancy, unspecified, unspecified trimester: Secondary | ICD-10-CM

## 2017-06-30 MED ORDER — HYDROXYPROGESTERONE CAPROATE 275 MG/1.1ML ~~LOC~~ SOAJ
275.0000 mg | Freq: Once | SUBCUTANEOUS | Status: AC
  Administered 2017-06-30: 275 mg via SUBCUTANEOUS

## 2017-06-30 NOTE — Progress Notes (Signed)
presents for 17P Injection, given in left arm, tolerated well. Administrations This Visit    HYDROXYprogesterone Caproate SOAJ 275 mg    Admin Date 06/30/2017 Action Given Dose 275 mg Route Subcutaneous Administered By Maretta Bees, RMA

## 2017-07-07 ENCOUNTER — Ambulatory Visit (INDEPENDENT_AMBULATORY_CARE_PROVIDER_SITE_OTHER): Payer: Medicaid Other | Admitting: Obstetrics and Gynecology

## 2017-07-07 ENCOUNTER — Encounter: Payer: Self-pay | Admitting: Obstetrics and Gynecology

## 2017-07-07 VITALS — BP 112/65 | HR 96 | Wt 153.4 lb

## 2017-07-07 DIAGNOSIS — O09219 Supervision of pregnancy with history of pre-term labor, unspecified trimester: Secondary | ICD-10-CM

## 2017-07-07 DIAGNOSIS — O0990 Supervision of high risk pregnancy, unspecified, unspecified trimester: Secondary | ICD-10-CM

## 2017-07-07 DIAGNOSIS — F1911 Other psychoactive substance abuse, in remission: Secondary | ICD-10-CM

## 2017-07-07 DIAGNOSIS — Z87898 Personal history of other specified conditions: Secondary | ICD-10-CM

## 2017-07-07 DIAGNOSIS — O09213 Supervision of pregnancy with history of pre-term labor, third trimester: Secondary | ICD-10-CM | POA: Diagnosis not present

## 2017-07-07 DIAGNOSIS — O09899 Supervision of other high risk pregnancies, unspecified trimester: Secondary | ICD-10-CM

## 2017-07-07 DIAGNOSIS — O0993 Supervision of high risk pregnancy, unspecified, third trimester: Secondary | ICD-10-CM

## 2017-07-07 MED ORDER — HYDROXYPROGESTERONE CAPROATE 275 MG/1.1ML ~~LOC~~ SOAJ
275.0000 mg | SUBCUTANEOUS | Status: AC
  Administered 2017-07-07 – 2017-07-28 (×4): 275 mg via SUBCUTANEOUS

## 2017-07-07 NOTE — Progress Notes (Signed)
   PRENATAL VISIT NOTE  Subjective:  Debra Malone is a 27 y.o. [id number] at [redacted]w[redacted]d being seen today for ongoing prenatal care.  She is currently monitored for the following issues for this high-risk pregnancy and has Supervision of high risk pregnancy, antepartum; History of preterm delivery, currently pregnant; and History of substance abuse on their problem list.  Patient reports no complaints.  Contractions: Not present. Vag. Bleeding: None.  Movement: Present. Denies leaking of fluid.   The following portions of the patient's history were reviewed and updated as appropriate: allergies, current medications, past family history, past medical history, past social history, past surgical history and problem list. Problem list updated.  Objective:   Vitals:   07/07/17 1112  BP: 112/65  Pulse: 96  Weight: 153 lb 6.4 oz (69.6 kg)    Fetal Status: Fetal Heart Rate (bpm): 150 Fundal Height: 32 cm Movement: Present     General:  Alert, oriented and cooperative. Patient is in no acute distress.  Skin: Skin is warm and dry. No rash noted.   Cardiovascular: Normal heart rate noted  Respiratory: Normal respiratory effort, no problems with respiration noted  Abdomen: Soft, gravid, appropriate for gestational age.  Pain/Pressure: Absent     Pelvic: Cervical exam deferred        Extremities: Normal range of motion.  Edema: None  Mental Status: Normal mood and affect. Normal behavior. Normal judgment and thought content.   Assessment and Plan:  Pregnancy: [id number] at [redacted]w[redacted]d  1. Supervision of high risk pregnancy, antepartum Patient is doing well without complaints  2. History of preterm delivery, currently pregnant Continue weekly 17-P until 36 weeks  3. History of substance abuse Denies any recent usage  Preterm labor symptoms and general obstetric precautions including but not limited to vaginal bleeding, contractions, leaking of fluid and fetal movement were reviewed in detail with the  patient. Please refer to After Visit Summary for other counseling recommendations.  Return in about 2 weeks (around 07/21/2017) for ROB.  No future appointments.  Catalina Antigua, MD

## 2017-07-14 ENCOUNTER — Ambulatory Visit (INDEPENDENT_AMBULATORY_CARE_PROVIDER_SITE_OTHER): Payer: Medicaid Other

## 2017-07-14 DIAGNOSIS — O09213 Supervision of pregnancy with history of pre-term labor, third trimester: Secondary | ICD-10-CM | POA: Diagnosis not present

## 2017-07-14 DIAGNOSIS — O09219 Supervision of pregnancy with history of pre-term labor, unspecified trimester: Principal | ICD-10-CM

## 2017-07-14 DIAGNOSIS — O09899 Supervision of other high risk pregnancies, unspecified trimester: Secondary | ICD-10-CM

## 2017-07-14 MED ORDER — HYDROXYPROGESTERONE CAPROATE 275 MG/1.1ML ~~LOC~~ SOAJ: 275.0000 mg | Freq: Once | SUBCUTANEOUS | Status: DC

## 2017-07-14 NOTE — Progress Notes (Signed)
Nurse visit for pt supply 17p given L arm w/o difficulty.  

## 2017-07-21 ENCOUNTER — Encounter: Payer: Self-pay | Admitting: Obstetrics and Gynecology

## 2017-07-21 ENCOUNTER — Ambulatory Visit (INDEPENDENT_AMBULATORY_CARE_PROVIDER_SITE_OTHER): Payer: Medicaid Other | Admitting: Obstetrics and Gynecology

## 2017-07-21 ENCOUNTER — Other Ambulatory Visit: Payer: Self-pay

## 2017-07-21 VITALS — BP 127/77 | HR 85 | Wt 153.4 lb

## 2017-07-21 DIAGNOSIS — O0993 Supervision of high risk pregnancy, unspecified, third trimester: Secondary | ICD-10-CM

## 2017-07-21 DIAGNOSIS — O09219 Supervision of pregnancy with history of pre-term labor, unspecified trimester: Principal | ICD-10-CM

## 2017-07-21 DIAGNOSIS — O09213 Supervision of pregnancy with history of pre-term labor, third trimester: Secondary | ICD-10-CM

## 2017-07-21 DIAGNOSIS — O0990 Supervision of high risk pregnancy, unspecified, unspecified trimester: Secondary | ICD-10-CM

## 2017-07-21 DIAGNOSIS — Z23 Encounter for immunization: Secondary | ICD-10-CM

## 2017-07-21 DIAGNOSIS — O09899 Supervision of other high risk pregnancies, unspecified trimester: Secondary | ICD-10-CM

## 2017-07-21 NOTE — Patient Instructions (Signed)
Third Trimester of Pregnancy The third trimester is from week 28 through week 40 (months 7 through 9). The third trimester is a time when the unborn baby (fetus) is growing rapidly. At the end of the ninth month, the fetus is about 20 inches in length and weighs 6-10 pounds. Body changes during your third trimester Your body will continue to go through many changes during pregnancy. The changes vary from woman to woman. During the third trimester:  Your weight will continue to increase. You can expect to gain 25-35 pounds (11-16 kg) by the end of the pregnancy.  You may begin to get stretch marks on your hips, abdomen, and breasts.  You may urinate more often because the fetus is moving lower into your pelvis and pressing on your bladder.  You may develop or continue to have heartburn. This is caused by increased hormones that slow down muscles in the digestive tract.  You may develop or continue to have constipation because increased hormones slow digestion and cause the muscles that push waste through your intestines to relax.  You may develop hemorrhoids. These are swollen veins (varicose veins) in the rectum that can itch or be painful.  You may develop swollen, bulging veins (varicose veins) in your legs.  You may have increased body aches in the pelvis, back, or thighs. This is due to weight gain and increased hormones that are relaxing your joints.  You may have changes in your hair. These can include thickening of your hair, rapid growth, and changes in texture. Some women also have hair loss during or after pregnancy, or hair that feels dry or thin. Your hair will most likely return to normal after your baby is born.  Your breasts will continue to grow and they will continue to become tender. A yellow fluid (colostrum) may leak from your breasts. This is the first milk you are producing for your baby.  Your belly button may stick out.  You may notice more swelling in your hands,  face, or ankles.  You may have increased tingling or numbness in your hands, arms, and legs. The skin on your belly may also feel numb.  You may feel short of breath because of your expanding uterus.  You may have more problems sleeping. This can be caused by the size of your belly, increased need to urinate, and an increase in your body's metabolism.  You may notice the fetus "dropping," or moving lower in your abdomen (lightening).  You may have increased vaginal discharge.  You may notice your joints feel loose and you may have pain around your pelvic bone.  What to expect at prenatal visits You will have prenatal exams every 2 weeks until week 36. Then you will have weekly prenatal exams. During a routine prenatal visit:  You will be weighed to make sure you and the baby are growing normally.  Your blood pressure will be taken.  Your abdomen will be measured to track your baby's growth.  The fetal heartbeat will be listened to.  Any test results from the previous visit will be discussed.  You may have a cervical check near your due date to see if your cervix has softened or thinned (effaced).  You will be tested for Group B streptococcus. This happens between 35 and 37 weeks.  Your health care provider may ask you:  What your birth plan is.  How you are feeling.  If you are feeling the baby move.  If you have had   any abnormal symptoms, such as leaking fluid, bleeding, severe headaches, or abdominal cramping.  If you are using any tobacco products, including cigarettes, chewing tobacco, and electronic cigarettes.  If you have any questions.  Other tests or screenings that may be performed during your third trimester include:  Blood tests that check for low iron levels (anemia).  Fetal testing to check the health, activity level, and growth of the fetus. Testing is done if you have certain medical conditions or if there are problems during the  pregnancy.  Nonstress test (NST). This test checks the health of your baby to make sure there are no signs of problems, such as the baby not getting enough oxygen. During this test, a belt is placed around your belly. The baby is made to move, and its heart rate is monitored during movement.  What is false labor? False labor is a condition in which you feel small, irregular tightenings of the muscles in the womb (contractions) that usually go away with rest, changing position, or drinking water. These are called Braxton Hicks contractions. Contractions may last for hours, days, or even weeks before true labor sets in. If contractions come at regular intervals, become more frequent, increase in intensity, or become painful, you should see your health care provider. What are the signs of labor?  Abdominal cramps.  Regular contractions that start at 10 minutes apart and become stronger and more frequent with time.  Contractions that start on the top of the uterus and spread down to the lower abdomen and back.  Increased pelvic pressure and dull back pain.  A watery or bloody mucus discharge that comes from the vagina.  Leaking of amniotic fluid. This is also known as your "water breaking." It could be a slow trickle or a gush. Let your health care provider know if it has a color or strange odor. If you have any of these signs, call your health care provider right away, even if it is before your due date. Follow these instructions at home: Medicines  Follow your health care provider's instructions regarding medicine use. Specific medicines may be either safe or unsafe to take during pregnancy.  Take a prenatal vitamin that contains at least 600 micrograms (mcg) of folic acid.  If you develop constipation, try taking a stool softener if your health care provider approves. Eating and drinking  Eat a balanced diet that includes fresh fruits and vegetables, whole grains, good sources of protein  such as meat, eggs, or tofu, and low-fat dairy. Your health care provider will help you determine the amount of weight gain that is right for you.  Avoid raw meat and uncooked cheese. These carry germs that can cause birth defects in the baby.  If you have low calcium intake from food, talk to your health care provider about whether you should take a daily calcium supplement.  Eat four or five small meals rather than three large meals a day.  Limit foods that are high in fat and processed sugars, such as fried and sweet foods.  To prevent constipation: ? Drink enough fluid to keep your urine clear or pale yellow. ? Eat foods that are high in fiber, such as fresh fruits and vegetables, whole grains, and beans. Activity  Exercise only as directed by your health care provider. Most women can continue their usual exercise routine during pregnancy. Try to exercise for 30 minutes at least 5 days a week. Stop exercising if you experience uterine contractions.  Avoid heavy   lifting.  Do not exercise in extreme heat or humidity, or at high altitudes.  Wear low-heel, comfortable shoes.  Practice good posture.  You may continue to have sex unless your health care provider tells you otherwise. Relieving pain and discomfort  Take frequent breaks and rest with your legs elevated if you have leg cramps or low back pain.  Take warm sitz baths to soothe any pain or discomfort caused by hemorrhoids. Use hemorrhoid cream if your health care provider approves.  Wear a good support bra to prevent discomfort from breast tenderness.  If you develop varicose veins: ? Wear support pantyhose or compression stockings as told by your healthcare provider. ? Elevate your feet for 15 minutes, 3-4 times a day. Prenatal care  Write down your questions. Take them to your prenatal visits.  Keep all your prenatal visits as told by your health care provider. This is important. Safety  Wear your seat belt at  all times when driving.  Make a list of emergency phone numbers, including numbers for family, friends, the hospital, and police and fire departments. General instructions  Avoid cat litter boxes and soil used by cats. These carry germs that can cause birth defects in the baby. If you have a cat, ask someone to clean the litter box for you.  Do not travel far distances unless it is absolutely necessary and only with the approval of your health care provider.  Do not use hot tubs, steam rooms, or saunas.  Do not drink alcohol.  Do not use any products that contain nicotine or tobacco, such as cigarettes and e-cigarettes. If you need help quitting, ask your health care provider.  Do not use any medicinal herbs or unprescribed drugs. These chemicals affect the formation and growth of the baby.  Do not douche or use tampons or scented sanitary pads.  Do not cross your legs for long periods of time.  To prepare for the arrival of your baby: ? Take prenatal classes to understand, practice, and ask questions about labor and delivery. ? Make a trial run to the hospital. ? Visit the hospital and tour the maternity area. ? Arrange for maternity or paternity leave through employers. ? Arrange for family and friends to take care of pets while you are in the hospital. ? Purchase a rear-facing car seat and make sure you know how to install it in your car. ? Pack your hospital bag. ? Prepare the baby's nursery. Make sure to remove all pillows and stuffed animals from the baby's crib to prevent suffocation.  Visit your dentist if you have not gone during your pregnancy. Use a soft toothbrush to brush your teeth and be gentle when you floss. Contact a health care provider if:  You are unsure if you are in labor or if your water has broken.  You become dizzy.  You have mild pelvic cramps, pelvic pressure, or nagging pain in your abdominal area.  You have lower back pain.  You have persistent  nausea, vomiting, or diarrhea.  You have an unusual or bad smelling vaginal discharge.  You have pain when you urinate. Get help right away if:  Your water breaks before 37 weeks.  You have regular contractions less than 5 minutes apart before 37 weeks.  You have a fever.  You are leaking fluid from your vagina.  You have spotting or bleeding from your vagina.  You have severe abdominal pain or cramping.  You have rapid weight loss or weight gain.    You have shortness of breath with chest pain.  You notice sudden or extreme swelling of your face, hands, ankles, feet, or legs.  Your baby makes fewer than 10 movements in 2 hours.  You have severe headaches that do not go away when you take medicine.  You have vision changes. Summary  The third trimester is from week 28 through week 40, months 7 through 9. The third trimester is a time when the unborn baby (fetus) is growing rapidly.  During the third trimester, your discomfort may increase as you and your baby continue to gain weight. You may have abdominal, leg, and back pain, sleeping problems, and an increased need to urinate.  During the third trimester your breasts will keep growing and they will continue to become tender. A yellow fluid (colostrum) may leak from your breasts. This is the first milk you are producing for your baby.  False labor is a condition in which you feel small, irregular tightenings of the muscles in the womb (contractions) that eventually go away. These are called Braxton Hicks contractions. Contractions may last for hours, days, or even weeks before true labor sets in.  Signs of labor can include: abdominal cramps; regular contractions that start at 10 minutes apart and become stronger and more frequent with time; watery or bloody mucus discharge that comes from the vagina; increased pelvic pressure and dull back pain; and leaking of amniotic fluid. This information is not intended to replace advice  given to you by your health care provider. Make sure you discuss any questions you have with your health care provider. Document Released: 02/11/2001 Document Revised: 07/26/2015 Document Reviewed: 04/20/2012 Elsevier Interactive Patient Education  2017 [employer]  

## 2017-07-21 NOTE — Progress Notes (Signed)
Subjective:  Debra Malone is a 27 y.o. [phone] at [redacted]w[redacted]d being seen today for ongoing prenatal care.  She is currently monitored for the following issues for this high-risk pregnancy and has Supervision of high risk pregnancy, antepartum; History of preterm delivery, currently pregnant; and History of substance abuse on their problem list.  Patient reports no complaints.  Contractions: Not present. Vag. Bleeding: None.  Movement: Present. Denies leaking of fluid.   The following portions of the patient's history were reviewed and updated as appropriate: allergies, current medications, past family history, past medical history, past social history, past surgical history and problem list. Problem list updated.  Objective:   Vitals:   07/21/17 1326  BP: 127/77  Pulse: 85  Weight: 153 lb 6.4 oz (69.6 kg)    Fetal Status: Fetal Heart Rate (bpm): 147   Movement: Present     General:  Alert, oriented and cooperative. Patient is in no acute distress.  Skin: Skin is warm and dry. No rash noted.   Cardiovascular: Normal heart rate noted  Respiratory: Normal respiratory effort, no problems with respiration noted  Abdomen: Soft, gravid, appropriate for gestational age. Pain/Pressure: Present     Pelvic:  Cervical exam deferred        Extremities: Normal range of motion.  Edema: Trace  Mental Status: Normal mood and affect. Normal behavior. Normal judgment and thought content.   Urinalysis:      Assessment and Plan:  Pregnancy: [id number] at [redacted]w[redacted]d  1. Supervision of high risk pregnancy, antepartum Stable - Tdap vaccine greater than or equal to 7yo IM  2. History of preterm delivery, currently pregnant Stable Continue with weekly 17 OHP til 36 weeks  Preterm labor symptoms and general obstetric precautions including but not limited to vaginal bleeding, contractions, leaking of fluid and fetal movement were reviewed in detail with the patient. Please refer to After Visit Summary for other  counseling recommendations.  Return in about 2 weeks (around 08/04/2017) for OB visit.   Hermina Staggers, MD

## 2017-07-21 NOTE — Progress Notes (Signed)
Refill called in for [facility] to Compounding Pharmacy.

## 2017-07-24 ENCOUNTER — Telehealth: Payer: Self-pay

## 2017-07-24 NOTE — Telephone Encounter (Signed)
TC from compounding pharmacy regarding no rf on Makena.  I gave verbal for Refill .

## 2017-07-28 ENCOUNTER — Ambulatory Visit (INDEPENDENT_AMBULATORY_CARE_PROVIDER_SITE_OTHER): Payer: Medicaid Other

## 2017-07-28 VITALS — BP 120/73 | HR 92 | Wt 152.9 lb

## 2017-07-28 DIAGNOSIS — O09899 Supervision of other high risk pregnancies, unspecified trimester: Secondary | ICD-10-CM

## 2017-07-28 DIAGNOSIS — Z3482 Encounter for supervision of other normal pregnancy, second trimester: Secondary | ICD-10-CM

## 2017-07-28 DIAGNOSIS — O09219 Supervision of pregnancy with history of pre-term labor, unspecified trimester: Principal | ICD-10-CM

## 2017-07-28 NOTE — Progress Notes (Signed)
Patient is in the office for 17-p injection, administered and pt tolerated well .Marland Kitchen Administrations This Visit    HYDROXYprogesterone Caproate SOAJ 275 mg    Admin Date 07/28/2017 Action Given Dose 275 mg Route Subcutaneous Administered By Katrina Stack, RN

## 2017-07-28 NOTE — Progress Notes (Signed)
I have reviewed the chart and agree with nursing staff's documentation of this patient's encounter.  Catalina Antigua, MD 07/28/2017 4:10 PM

## 2017-08-02 ENCOUNTER — Inpatient Hospital Stay (HOSPITAL_COMMUNITY)
Admission: AD | Admit: 2017-08-02 | Discharge: 2017-08-05 | DRG: 806 | Disposition: A | Discharge status: Home / Self Care | Payer: Medicaid Other | Attending: Obstetrics and Gynecology | Admitting: Obstetrics and Gynecology

## 2017-08-02 ENCOUNTER — Encounter (HOSPITAL_COMMUNITY): Payer: Self-pay | Admitting: *Deleted

## 2017-08-02 DIAGNOSIS — O99324 Drug use complicating childbirth: Secondary | ICD-10-CM | POA: Diagnosis present

## 2017-08-02 DIAGNOSIS — F1410 Cocaine abuse, uncomplicated: Secondary | ICD-10-CM | POA: Diagnosis present

## 2017-08-02 DIAGNOSIS — F1911 Other psychoactive substance abuse, in remission: Secondary | ICD-10-CM

## 2017-08-02 DIAGNOSIS — Z3A36 36 weeks gestation of pregnancy: Secondary | ICD-10-CM | POA: Diagnosis not present

## 2017-08-02 DIAGNOSIS — Z87891 Personal history of nicotine dependence: Secondary | ICD-10-CM

## 2017-08-02 DIAGNOSIS — O0990 Supervision of high risk pregnancy, unspecified, unspecified trimester: Secondary | ICD-10-CM

## 2017-08-02 LAB — CBC
HCT: 39.7 % (ref 36.0–46.0)
Hemoglobin: 12.8 g/dL (ref 12.0–15.0)
MCH: 28.7 pg (ref 26.0–34.0)
MCHC: 32.2 g/dL (ref 30.0–36.0)
MCV: 89 fL (ref 78.0–100.0)
Platelets: 197 10*3/uL (ref 150–400)
RBC: 4.46 MIL/uL (ref 3.87–5.11)
RDW: 13.2 % (ref 11.5–15.5)
WBC: 17.3 10*3/uL — ABNORMAL HIGH (ref 4.0–10.5)

## 2017-08-02 LAB — ABO/RH: ABO/RH(D): A POS

## 2017-08-02 LAB — URINALYSIS, ROUTINE W REFLEX MICROSCOPIC
Bilirubin Urine: NEGATIVE
Glucose, UA: NEGATIVE mg/dL
Hgb urine dipstick: NEGATIVE
Ketones, ur: 5 mg/dL — AB
Nitrite: NEGATIVE
Protein, ur: NEGATIVE mg/dL
Specific Gravity, Urine: 1.015 (ref 1.005–1.030)
pH: 6 (ref 5.0–8.0)

## 2017-08-02 LAB — TYPE AND SCREEN
ABO/RH(D): A POS
Antibody Screen: NEGATIVE

## 2017-08-02 LAB — RAPID URINE DRUG SCREEN, HOSP PERFORMED
Amphetamines: NOT DETECTED
Barbiturates: NOT DETECTED
Benzodiazepines: NOT DETECTED
Cocaine: NOT DETECTED
Opiates: NOT DETECTED
Tetrahydrocannabinol: NOT DETECTED

## 2017-08-02 LAB — GROUP B STREP BY PCR: Group B strep by PCR: NEGATIVE

## 2017-08-02 LAB — OB RESULTS CONSOLE GBS: GBS: NEGATIVE

## 2017-08-02 MED ORDER — LACTATED RINGERS IV BOLUS
500.0000 mL | Freq: Once | INTRAVENOUS | Status: AC
  Administered 2017-08-02: 500 mL via INTRAVENOUS

## 2017-08-02 MED ORDER — LACTATED RINGERS IV SOLN
500.0000 mL | INTRAVENOUS | Status: DC | PRN
  Administered 2017-08-02: 500 mL via INTRAVENOUS

## 2017-08-02 MED ORDER — BETAMETHASONE SOD PHOS & ACET 6 (3-3) MG/ML IJ SUSP
12.0000 mg | INTRAMUSCULAR | Status: DC
  Administered 2017-08-02: 12 mg via INTRAMUSCULAR
  Filled 2017-08-02 (×2): qty 2

## 2017-08-02 MED ORDER — FLEET ENEMA 7-19 GM/118ML RE ENEM: 1.0000 | ENEMA | Freq: Once | RECTAL | Status: DC

## 2017-08-02 MED ORDER — LACTATED RINGERS IV SOLN
INTRAVENOUS | Status: DC
  Administered 2017-08-02 – 2017-08-03 (×3): via INTRAVENOUS

## 2017-08-02 MED ORDER — SOD CITRATE-CITRIC ACID 500-334 MG/5ML PO SOLN: 30.0000 mL | ORAL | Status: DC | PRN

## 2017-08-02 MED ORDER — NIFEDIPINE 10 MG PO CAPS: 10.0000 mg | ORAL_CAPSULE | ORAL | Status: DC | PRN

## 2017-08-02 MED ORDER — OXYCODONE-ACETAMINOPHEN 5-325 MG PO TABS: 2.0000 | ORAL_TABLET | ORAL | Status: DC | PRN

## 2017-08-02 MED ORDER — OXYCODONE-ACETAMINOPHEN 5-325 MG PO TABS: 1.0000 | ORAL_TABLET | ORAL | Status: DC | PRN

## 2017-08-02 MED ORDER — ACETAMINOPHEN 325 MG PO TABS: 650.0000 mg | ORAL_TABLET | ORAL | Status: DC | PRN

## 2017-08-02 MED ORDER — OXYTOCIN BOLUS FROM INFUSION
500.0000 mL | Freq: Once | INTRAVENOUS | Status: AC
  Administered 2017-08-03: 500 mL via INTRAVENOUS

## 2017-08-02 MED ORDER — OXYTOCIN 40 UNITS IN LACTATED RINGERS INFUSION - SIMPLE MED
2.5000 [IU]/h | INTRAVENOUS | Status: DC
  Filled 2017-08-02: qty 1000

## 2017-08-02 MED ORDER — ONDANSETRON HCL 4 MG/2ML IJ SOLN: 4.0000 mg | Freq: Four times a day (QID) | INTRAMUSCULAR | Status: DC | PRN

## 2017-08-02 MED ORDER — LACTATED RINGERS IV SOLN
INTRAVENOUS | Status: DC
  Administered 2017-08-02: 12:00:00 via INTRAVENOUS

## 2017-08-02 MED ORDER — LIDOCAINE HCL (PF) 1 % IJ SOLN
30.0000 mL | INTRAMUSCULAR | Status: DC | PRN
  Filled 2017-08-02: qty 30

## 2017-08-02 NOTE — H&P (Signed)
Obstetric History and Physical  Debra Malone is a 27 y.o. [phone] with IUP at 3082w2d presenting for preterm contractions that started last night. She denies bleeding, leaking of fluid, decreased fetal movements, dysuria, NV or other ob-gyn complaint. She has a history of pre-term delivery at 30 weeks and has been receiving 17p injections.   Prenatal Course Source of Care: [facility] Dating: By 2nd trimester US --->  Estimated Date of Delivery: 08/28/17 Pregnancy complications or risks: Patient Active Problem List   Diagnosis Date Noted  . History of substance abuse 05/04/2017  . Supervision of high risk pregnancy, antepartum 04/08/2017  . History of preterm delivery, currently pregnant 04/08/2017   She plans to bottle feed She desires IUD for postpartum contraception.   Sono:    @[redacted]w[redacted]d , CWD, normal anatomy, breech presentation, right lateral placenta, 562g, 32% EFW  Prenatal labs and studies: ABO, Rh:  A POS Antibody:  NEG Rubella:  IMMUNE RPR: Non Reactive (04/01 1029)  HBsAg:   NEG HIV: Non Reactive (04/01 1029)  GBS: UNKNOWN 2 hr Glucola  normal Genetic screening normal Anatomy US normal  Prenatal Transfer Tool  Maternal Diabetes: No Genetic Screening: Normal Maternal Ultrasounds/Referrals: Normal Fetal Ultrasounds or other Referrals:  Referred to Materal Fetal Medicine  Maternal Substance Abuse:  Yes:  Type: Cocaine Significant Maternal Medications:  None Significant Maternal Lab Results: None  Past Medical History:  Diagnosis Date  . History of preterm delivery, currently pregnant 04/14/2015   30 weeks    Past Surgical History:  Procedure Laterality Date  . NO PAST SURGERIES      OB History  Gravida Para Term Preterm AB Living  4 2 0 2 1 1   SAB TAB Ectopic Multiple Live Births  1 0 0 0 2    # Outcome Date GA Lbr Len/2nd Weight Sex Delivery Anes PTL Lv  4 Current           3 Preterm 04/14/15 8974w0d  1.361 kg (3 lb) M Vag-Spont   LIV     Complications:  Preterm premature rupture of membranes (PPROM) with onset of labor after 24 hours of rupture in first trimester, antepartum  2 Preterm 07/22/13 5332w0d  0.369 kg (13 oz)  Vag-Spont   ND  1 SAB  10966w0d           Social History   Socioeconomic History  . Marital status: Single    Spouse name: Not on file  . Number of children: Not on file  . Years of education: Not on file  . Highest education level: Not on file  Occupational History  . Not on file  Social Needs  . Financial resource strain: Not on file  . Food insecurity:    Worry: Not on file    Inability: Not on file  . Transportation needs:    Medical: Not on file    Non-medical: Not on file  Tobacco Use  . Smoking status: Former Games developermoker  . Smokeless tobacco: Never Used  . Tobacco comment: marijuana, stop with preg  Substance and Sexual Activity  . Alcohol use: No    Frequency: Never  . Drug use: Yes    Types: Marijuana    Comment: stopped when confirmed pregnancy  . Sexual activity: Yes    Partners: Male    Birth control/protection: None  Lifestyle  . Physical activity:    Days per week: Not on file    Minutes per session: Not on file  . Stress: Not on file  Relationships  . Social connections:    Talks on phone: Not on file    Gets together: Not on file    Attends religious service: Not on file    Active member of club or organization: Not on file    Attends meetings of clubs or organizations: Not on file    Relationship status: Not on file  Other Topics Concern  . Not on file  Social History Narrative  . Not on file    History reviewed. No pertinent family history.  Medications Prior to Admission  Medication Sig Dispense Refill Last Dose  . Prenatal Vit-Fe Fumarate-FA (PRENATAL MULTIVITAMIN) TABS tablet Take 1 tablet by mouth daily at 12 noon.   08/01/2017 at Unknown time    No Known Allergies  Review of Systems: Negative except for what is mentioned in HPI.  Physical Exam: BP 131/80 (BP Location:  Right Arm)   Pulse 88   Temp 99 F (37.2 C) (Oral)   Resp 15   Ht 5\' 4"  (1.626 m)   Wt 68 kg (150 lb)   LMP 11/21/2016 (Approximate)   SpO2 100%   BMI 25.75 kg/m  CONSTITUTIONAL: Well-developed, well-nourished female in no acute distress.  HENT:  Normocephalic, atraumatic, External right and left ear normal. Oropharynx is clear and moist EYES: Conjunctivae and EOM are normal. Pupils are equal, round, and reactive to light. No scleral icterus.  NECK: Normal range of motion, supple, no masses SKIN: Skin is warm and dry. No rash noted. Not diaphoretic. No erythema. No pallor. NEUROLOGIC: Alert and oriented to person, place, and time. Normal reflexes, muscle tone coordination. No cranial nerve deficit noted. PSYCHIATRIC: Normal mood and affect. Normal behavior. Normal judgment and thought content. CARDIOVASCULAR: Normal heart rate noted, regular rhythm RESPIRATORY: Effort and breath sounds normal, no problems with respiration noted ABDOMEN: Soft, nontender, nondistended, gravid. MUSCULOSKELETAL: Normal range of motion. No edema and no tenderness. 2+ distal pulses.  Cervical Exam: Dilation: 5 Effacement (%): 80 Station: -2 Presentation: Vertex Exam by:: Kooistra CNM  FHT:  Baseline rate 135 bpm   Variability moderate  Accelerations present   Decelerations none Contractions: Every 1-4 mins   Pertinent Labs/Studies:   Results for orders placed or performed during the hospital encounter of 08/02/17 (from the past 24 hour(s))  Urinalysis, Routine w reflex microscopic     Status: Abnormal   Collection Time: 08/02/17 10:30 AM  Result Value Ref Range   Color, Urine YELLOW YELLOW   APPearance CLEAR CLEAR   Specific Gravity, Urine 1.015 1.005 - 1.030   pH 6.0 5.0 - 8.0   Glucose, UA NEGATIVE NEGATIVE mg/dL   Hgb urine dipstick NEGATIVE NEGATIVE   Bilirubin Urine NEGATIVE NEGATIVE   Ketones, ur 5 (A) NEGATIVE mg/dL   Protein, ur NEGATIVE NEGATIVE mg/dL   Nitrite NEGATIVE NEGATIVE    Leukocytes, UA SMALL (A) NEGATIVE   RBC / HPF 0-5 0 - 5 RBC/hpf   WBC, UA 0-5 0 - 5 WBC/hpf   Bacteria, UA RARE (A) NONE SEEN   Squamous Epithelial / LPF 0-5 0 - 5   Mucus PRESENT   Group B strep by PCR     Status: None   Collection Time: 08/02/17 12:23 PM  Result Value Ref Range   Group B strep by PCR NEGATIVE NEGATIVE    Assessment : Debra Malone is a 27 y.o. [id number] at [redacted]w[redacted]d being admitted for preterm labor.  Plan: Labor: Expectant management. S/p BMZ x1. Analgesia as needed. FWB: Reassuring fetal  heart tracing. GBS PCR negative Delivery plan: Hopeful for vaginal delivery   Caryl Ada, DO OB [facility], [facility] 08/02/2017, 3:42 PM

## 2017-08-02 NOTE — MAU Provider Note (Addendum)
Patient Debra Malone is a 27 y.o. [phone] At [redacted]w[redacted]d here with complaints of contractions that started last night in the middle of the night. She denies bleeding, leaking of fluid, decreased fetal movements, dysuria, NV or other ob-gyn complaint. She has a history of pre-term delivery at 30 weeks and has been receiving 17p injections.  History     CSN: [id number]  Arrival date and time: 08/02/17 1041   First Provider Initiated Contact with Patient 08/02/17 1158      Chief Complaint  Patient presents with  . Abdominal Pain   Abdominal Pain  This is a new problem. The current episode started yesterday. The onset quality is sudden. The problem occurs constantly. The problem has been gradually worsening. The pain is located in the generalized abdominal region. The pain is at a severity of 6/10. The quality of the pain is cramping. Pertinent negatives include no constipation, diarrhea, dysuria, nausea or vomiting.  Patient states that she had only 2 hotdogs yesterday, and some liquids. She does not remember if she also ate a sandwich. Patient had BM this morning; reports that it was normal for her.   OB History    Gravida  4   Para  2   Term  0   Preterm  2   AB  1   Living  1     SAB  1   TAB  0   Ectopic  0   Multiple  0   Live Births  2           Past Medical History:  Diagnosis Date  . History of preterm delivery, currently pregnant 04/14/2015   30 weeks    Past Surgical History:  Procedure Laterality Date  . NO PAST SURGERIES      History reviewed. No pertinent family history.  Social History   Tobacco Use  . Smoking status: Former Games developer  . Smokeless tobacco: Never Used  . Tobacco comment: marijuana, stop with preg  Substance Use Topics  . Alcohol use: No    Frequency: Never  . Drug use: Yes    Types: Marijuana    Comment: stopped when confirmed pregnancy    Allergies: No Known Allergies  Medications Prior to Admission  Medication Sig  Dispense Refill Last Dose  . Prenatal Vit-Fe Fumarate-FA (MULTIVITAMIN-PRENATAL) 27-0.8 MG TABS tablet Take 1 tablet by mouth daily at 12 noon.   Taking    Review of Systems  Constitutional: Negative.   HENT: Negative.   Respiratory: Negative.   Gastrointestinal: Positive for abdominal pain. Negative for constipation, diarrhea, nausea and vomiting.  Genitourinary: Negative for dysuria.  Neurological: Negative.   Psychiatric/Behavioral: Negative.    Physical Exam   Blood pressure 131/80, pulse 88, temperature (!) 97.5 F (36.4 C), temperature source Oral, resp. rate 17, weight 150 lb 1.9 oz (68.1 kg), last menstrual period 11/21/2016, SpO2 100 %.  Physical Exam  Constitutional: She is oriented to person, place, and time. She appears well-developed.  HENT:  Head: Normocephalic.  Neck: Normal range of motion.  Respiratory: Effort normal.  GI: Soft. She exhibits no distension. There is no tenderness. There is no rebound and no guarding.  Genitourinary: Vagina normal. No vaginal discharge found.  Genitourinary Comments: Normal external genitalia; Cervix is 3.5, 80% with a bulging bag. Cephalic presentation.   Musculoskeletal: Normal range of motion.  Neurological: She is alert and oriented to person, place, and time.  Skin: Skin is warm and dry.  Psychiatric: She has a  normal mood and affect.    MAU Course  Procedures  MDM BMZ x 1 IV LR NST: 135 bpm, mod var, present acel, no decels, CTx q 2 min GBS by PCR Admission labs pending Cervix now 5 cm; bulging bag of water.  Assessment and Plan  1. Patient to be admitted for SOL; orders placed.    Charlesetta GaribaldiKathryn Lorraine Izear Pine 08/02/2017, 12:08 PM

## 2017-08-02 NOTE — Progress Notes (Signed)
LABOR PROGRESS NOTE  Debra Malone is a 27 y.o. [phone] at 4651w2d  admitted for PTL  Subjective: Pt feels abdominal and back discomfort with contractions but is bearable at this time. Considering epidural if worsens. Otherwise, no complaints  Objective: BP 134/75   Pulse 92   Temp 98.7 F (37.1 C) (Oral)   Resp 16   Ht 5\' 4"  (1.626 m)   Wt 150 lb (68 kg)   LMP 11/21/2016 (Approximate)   SpO2 100%   BMI 25.75 kg/m  or  Vitals:   08/02/17 1700 08/02/17 1914 08/02/17 1917 08/02/17 2011  BP: 130/60 119/76  134/75  Pulse: 88 99  92  Resp: 17 16  16   Temp:   98.7 F (37.1 C)   TempSrc:   Oral   SpO2:      Weight:      Height:       Dilation: 5 Effacement (%): 80 Station: -2 Presentation: Vertex Exam by:: Kooistra CNM FHT: 160, mod var, no accel/decel Uterine activity: 2-5 min, mild/mod  Labs: Lab Results  Component Value Date   WBC 17.3 (H) 08/02/2017   HGB 12.8 08/02/2017   HCT 39.7 08/02/2017   MCV 89.0 08/02/2017   PLT 197 08/02/2017    Patient Active Problem List   Diagnosis Date Noted  . Indication for care in labor and delivery, antepartum 08/02/2017  . History of substance abuse 05/04/2017  . Supervision of high risk pregnancy, antepartum 04/08/2017  . History of preterm delivery, currently pregnant 04/08/2017    Assessment / Plan: 27 y.o. [id number] at 7151w2d here for PTL  Labor: PTL, cont to monitor Fetal Wellbeing:  Cat II due to Fetal tachycardia, allow pt to eat, 500mL LR bolus, cont to monitor Pain Control:  Supportive care, possible epidural Anticipated MOD:  SVD  Garnette GunnerAaron B Thompson, MD PGY-1 6/2/20199:19 PM

## 2017-08-02 NOTE — MAU Note (Addendum)
Patient c/o  +lower abdominal pain/contractions Intermittent Every 6-8 minutes she reports  Denies VB or LOF +FM  Currently taking 17 OHP

## 2017-08-03 ENCOUNTER — Encounter (HOSPITAL_COMMUNITY): Payer: Self-pay | Admitting: *Deleted

## 2017-08-03 ENCOUNTER — Other Ambulatory Visit: Payer: Self-pay

## 2017-08-03 ENCOUNTER — Inpatient Hospital Stay (HOSPITAL_COMMUNITY): Payer: Medicaid Other | Admitting: Anesthesiology

## 2017-08-03 DIAGNOSIS — Z3A36 36 weeks gestation of pregnancy: Secondary | ICD-10-CM

## 2017-08-03 LAB — RPR: RPR Ser Ql: NONREACTIVE

## 2017-08-03 MED ORDER — PRENATAL MULTIVITAMIN CH
1.0000 | ORAL_TABLET | Freq: Every day | ORAL | Status: DC
  Administered 2017-08-04 – 2017-08-05 (×2): 1 via ORAL
  Filled 2017-08-03 (×2): qty 1

## 2017-08-03 MED ORDER — LACTATED RINGERS IV SOLN
500.0000 mL | Freq: Once | INTRAVENOUS | Status: AC
  Administered 2017-08-03: 500 mL via INTRAVENOUS

## 2017-08-03 MED ORDER — BUTORPHANOL TARTRATE 1 MG/ML IJ SOLN
INTRAMUSCULAR | Status: AC
  Filled 2017-08-03: qty 2

## 2017-08-03 MED ORDER — SIMETHICONE 80 MG PO CHEW: 80.0000 mg | CHEWABLE_TABLET | ORAL | Status: DC | PRN

## 2017-08-03 MED ORDER — IBUPROFEN 600 MG PO TABS
600.0000 mg | ORAL_TABLET | Freq: Four times a day (QID) | ORAL | Status: DC
  Administered 2017-08-03 – 2017-08-05 (×7): 600 mg via ORAL
  Filled 2017-08-03 (×8): qty 1

## 2017-08-03 MED ORDER — PHENYLEPHRINE 40 MCG/ML (10ML) SYRINGE FOR IV PUSH (FOR BLOOD PRESSURE SUPPORT)
80.0000 ug | PREFILLED_SYRINGE | INTRAVENOUS | Status: DC | PRN
  Filled 2017-08-03: qty 5

## 2017-08-03 MED ORDER — BUTORPHANOL TARTRATE 1 MG/ML IJ SOLN
INTRAMUSCULAR | Status: AC
  Administered 2017-08-03: 2 mg via INTRAVENOUS
  Filled 2017-08-03: qty 2

## 2017-08-03 MED ORDER — TETANUS-DIPHTH-ACELL PERTUSSIS 5-2.5-18.5 LF-MCG/0.5 IM SUSP: 0.5000 mL | Freq: Once | INTRAMUSCULAR | Status: DC

## 2017-08-03 MED ORDER — ONDANSETRON HCL 4 MG/2ML IJ SOLN: 4.0000 mg | INTRAMUSCULAR | Status: DC | PRN

## 2017-08-03 MED ORDER — FENTANYL 2.5 MCG/ML BUPIVACAINE 1/10 % EPIDURAL INFUSION (WH - ANES)
14.0000 mL/h | INTRAMUSCULAR | Status: DC | PRN
  Administered 2017-08-03: 14 mL/h via EPIDURAL
  Filled 2017-08-03: qty 100

## 2017-08-03 MED ORDER — BENZOCAINE-MENTHOL 20-0.5 % EX AERO
1.0000 "application " | INHALATION_SPRAY | CUTANEOUS | Status: DC | PRN
  Administered 2017-08-03: 1 via TOPICAL
  Filled 2017-08-03: qty 56

## 2017-08-03 MED ORDER — SENNOSIDES-DOCUSATE SODIUM 8.6-50 MG PO TABS
2.0000 | ORAL_TABLET | ORAL | Status: DC
  Administered 2017-08-04 (×2): 2 via ORAL
  Filled 2017-08-03 (×2): qty 2

## 2017-08-03 MED ORDER — ZOLPIDEM TARTRATE 5 MG PO TABS: 5.0000 mg | ORAL_TABLET | Freq: Every evening | ORAL | Status: DC | PRN

## 2017-08-03 MED ORDER — WITCH HAZEL-GLYCERIN EX PADS
1.0000 "application " | MEDICATED_PAD | CUTANEOUS | Status: DC | PRN
  Administered 2017-08-03: 1 via TOPICAL

## 2017-08-03 MED ORDER — LIDOCAINE HCL (PF) 1 % IJ SOLN
INTRAMUSCULAR | Status: DC | PRN
  Administered 2017-08-03 (×2): 4 mL via EPIDURAL

## 2017-08-03 MED ORDER — BUTORPHANOL TARTRATE 1 MG/ML IJ SOLN
2.0000 mg | Freq: Once | INTRAMUSCULAR | Status: AC
  Administered 2017-08-03: 2 mg via INTRAVENOUS

## 2017-08-03 MED ORDER — COCONUT OIL OIL: 1.0000 "application " | TOPICAL_OIL | Status: DC | PRN

## 2017-08-03 MED ORDER — DIPHENHYDRAMINE HCL 50 MG/ML IJ SOLN: 12.5000 mg | INTRAMUSCULAR | Status: DC | PRN

## 2017-08-03 MED ORDER — DIPHENHYDRAMINE HCL 25 MG PO CAPS: 25.0000 mg | ORAL_CAPSULE | Freq: Four times a day (QID) | ORAL | Status: DC | PRN

## 2017-08-03 MED ORDER — EPHEDRINE 5 MG/ML INJ
10.0000 mg | INTRAVENOUS | Status: DC | PRN
  Filled 2017-08-03: qty 2

## 2017-08-03 MED ORDER — ONDANSETRON HCL 4 MG PO TABS: 4.0000 mg | ORAL_TABLET | ORAL | Status: DC | PRN

## 2017-08-03 MED ORDER — ACETAMINOPHEN 325 MG PO TABS: 650.0000 mg | ORAL_TABLET | ORAL | Status: DC | PRN

## 2017-08-03 MED ORDER — PHENYLEPHRINE 40 MCG/ML (10ML) SYRINGE FOR IV PUSH (FOR BLOOD PRESSURE SUPPORT)
80.0000 ug | PREFILLED_SYRINGE | INTRAVENOUS | Status: DC | PRN
  Filled 2017-08-03: qty 5
  Filled 2017-08-03: qty 10

## 2017-08-03 MED ORDER — FENTANYL 2.5 MCG/ML BUPIVACAINE 1/10 % EPIDURAL INFUSION (WH - ANES): 14.0000 mL/h | INTRAMUSCULAR | Status: DC | PRN

## 2017-08-03 MED ORDER — DIBUCAINE 1 % RE OINT: 1.0000 "application " | TOPICAL_OINTMENT | RECTAL | Status: DC | PRN

## 2017-08-03 NOTE — Anesthesia Pain Management Evaluation Note (Signed)
  CRNA Pain Management Visit Note  Patient: Marcelyn BruinsRayshona M Ruda, 27 y.o., female  "Hello I am a member of the anesthesia team at [facility]. We have an anesthesia team available at all times to provide care throughout the hospital, including epidural management and anesthesia for C-section. I don't know your plan for the delivery whether it a natural birth, water birth, IV sedation, nitrous supplementation, doula or epidural, but we want to meet your pain goals."   1.Was your pain managed to your expectations on prior hospitalizations?   Yes   2.What is your expectation for pain management during this hospitalization?     Epidural  3.How can we help you reach that goal? Maintain epidural until delivery.  Record the patient's initial score and the patient's pain goal.   Pain: 0 with epidural.  Pain Goal: 1 [facility] wants you to be able to say your pain was always managed very well.  Maynor Mwangi 08/03/2017

## 2017-08-03 NOTE — Anesthesia Preprocedure Evaluation (Signed)
Anesthesia Evaluation  °Patient identified by MRN, date of birth, ID band °Patient awake ° ° ° °Reviewed: °Allergy & Precautions, NPO status , Patient's Chart, lab work & pertinent test results ° °Airway °Mallampati: II ° °TM Distance: >3 FB °Neck ROM: Full ° ° ° Dental °no notable dental hx. ° °  °Pulmonary °former smoker,  °  °Pulmonary exam normal °breath sounds clear to auscultation ° ° ° ° ° ° Cardiovascular °negative cardio ROS °Normal cardiovascular exam °Rhythm:Regular Rate:Normal ° ° °  °Neuro/Psych °negative neurological ROS ° negative psych ROS  ° GI/Hepatic °negative GI ROS, Neg liver ROS,   °Endo/Other  °negative endocrine ROS ° Renal/GU °negative Renal ROS  ° °  °Musculoskeletal °negative musculoskeletal ROS °(+)  ° Abdominal °  °Peds ° Hematology °negative hematology ROS °(+)   °Anesthesia Other Findings ° ° Reproductive/Obstetrics °(+) Pregnancy ° °  ° ° ° ° ° ° ° ° ° ° ° ° ° °  °  ° ° ° ° ° ° ° ° °Anesthesia Physical ° °Anesthesia Plan ° °ASA: II ° °Anesthesia Plan: Epidural  ° °Post-op Pain Management:   ° °Induction:  ° °PONV Risk Score and Plan:  ° °Airway Management Planned:  ° °Additional Equipment:  ° °Intra-op Plan:  ° °Post-operative Plan:  ° °Informed Consent: I have reviewed the patients History and Physical, chart, labs and discussed the procedure including the risks, benefits and alternatives for the proposed anesthesia with the patient or authorized representative who has indicated his/her understanding and acceptance.  ° ° ° ° ° °Plan Discussed with:  ° °Anesthesia Plan Comments:   ° ° ° ° ° ° °Anesthesia Quick Evaluation ° °

## 2017-08-03 NOTE — Plan of Care (Signed)
Patient is progressing well.  Patient is voiding and bleeding is WNL.  She has passed flatus.

## 2017-08-03 NOTE — Anesthesia Procedure Notes (Signed)
Epidural Patient location during procedure: OB  Staffing Anesthesiologist: Wendy Hoback, MD Performed: anesthesiologist   Preanesthetic Checklist Completed: patient identified, pre-op evaluation, timeout performed, IV checked, risks and benefits discussed and monitors and equipment checked  Epidural Patient position: sitting Prep: site prepped and draped and DuraPrep Patient monitoring: heart rate, continuous pulse ox and blood pressure Approach: midline Location: L3-L4 Injection technique: LOR air and LOR saline  Needle:  Needle type: Tuohy  Needle gauge: 17 G Needle length: 9 cm Needle insertion depth: 6 cm Catheter type: closed end flexible Catheter size: 19 Gauge Catheter at skin depth: 11 cm Test dose: negative  Assessment Sensory level: T8 Events: blood not aspirated, injection not painful, no injection resistance, negative IV test and no paresthesia  Additional Notes Reason for block:procedure for pain     

## 2017-08-03 NOTE — Anesthesia Postprocedure Evaluation (Signed)
Anesthesia Post Note  Patient: Marcelyn BruinsRayshona M Bendavid  Procedure(s) Performed: AN AD HOC LABOR EPIDURAL     Patient location during evaluation: Mother Baby Anesthesia Type: Epidural Level of consciousness: awake and alert Pain management: pain level controlled Vital Signs Assessment: post-procedure vital signs reviewed and stable Respiratory status: spontaneous breathing, nonlabored ventilation and respiratory function stable Cardiovascular status: stable Postop Assessment: no headache, no backache and epidural receding Anesthetic complications: no    Last Vitals:  Vitals:   08/03/17 1146 08/03/17 1206  BP: (!) 116/53 (!) 143/69  Pulse: 95 (!) 102  Resp: 20 18  Temp:    SpO2:      Last Pain:  Vitals:   08/03/17 1206  TempSrc:   PainSc: 0-No pain   Pain Goal: Patients Stated Pain Goal: 3 (08/03/17 [id number])               Marrion CoyMERRITT,Fletcher Ostermiller

## 2017-08-03 NOTE — Progress Notes (Signed)
LABOR PROGRESS NOTE  Marcelyn BruinsRayshona M Glandon is a 27 y.o. [phone] at 5981w2d  admitted for PTL  Subjective: Resting comfortably. No complaints  Objective: BP 131/76   Pulse (!) 101   Temp 97.9 F (36.6 C) (Oral)   Resp 17   Ht 5\' 4"  (1.626 m)   Wt 150 lb (68 kg)   LMP 11/21/2016 (Approximate)   SpO2 97%   BMI 25.75 kg/m  or  Vitals:   08/03/17 0127 08/03/17 0129 08/03/17 0159 08/03/17 0230  BP: 131/76     Pulse: (!) 101     Resp: 17     Temp:      TempSrc:      SpO2:  98% 100% 97%  Weight:      Height:       Dilation: 6 Effacement (%): 80, 90 Station: -2 Presentation: Vertex Exam by:: e. poore, rnc FHT: 130, mod var, no accel/decel Uterine activity: 2-5 min, mild/mod  Labs: Lab Results  Component Value Date   WBC 17.3 (H) 08/02/2017   HGB 12.8 08/02/2017   HCT 39.7 08/02/2017   MCV 89.0 08/02/2017   PLT 197 08/02/2017    Patient Active Problem List   Diagnosis Date Noted  . Indication for care in labor and delivery, antepartum 08/02/2017  . History of substance abuse 05/04/2017  . Supervision of high risk pregnancy, antepartum 04/08/2017  . History of preterm delivery, currently pregnant 04/08/2017    Assessment / Plan: 27 y.o. [id number] at 4281w2d here for PTL  Labor: PTL, cont to monitor Fetal Wellbeing:  Cat I, improved after fluid bolus Pain Control:  Supportive care Anticipated MOD:  SVD  Garnette GunnerAaron B Thompson, MD PGY-1 6/3/20193:36 AM

## 2017-08-04 ENCOUNTER — Encounter: Payer: Medicaid Other | Admitting: Obstetrics and Gynecology

## 2017-08-04 NOTE — Progress Notes (Signed)
POSTPARTUM PROGRESS NOTE  Post Partum Day 1  Subjective:  Debra Malone is a 27 y.o. [phone] s/p SVD at 874w3d.  She reports she is doing well. No acute events overnight. She denies any problems with ambulating, voiding or po intake. Denies nausea or vomiting.  Pain is controlled  Objective: Blood pressure 102/60, pulse 90, temperature 98.6 F (37 C), temperature source Oral, resp. rate 16, height 5\' 4"  (1.626 m), weight 68 kg (150 lb), last menstrual period 11/21/2016, SpO2 100 %, unknown if currently breastfeeding.  Physical Exam:  General: alert, cooperative and no distress Chest: no respiratory distress Heart:regular rate, distal pulses intact Abdomen: soft, nontender,  Uterine Fundus: firm, appropriately tender DVT Evaluation: No calf swelling or tenderness Extremities: trace edema Skin: warm, dry  Recent Labs    08/02/17 1931  HGB 12.8  HCT 39.7    Assessment/Plan: Debra Malone is a 27 y.o. [id number] s/p SVD at 7074w3d   PPD#1 - Doing well  Routine postpartum care Contraception: IUD Feeding: bottle Dispo: Plan for discharge tomorrow.   LOS: 2 days   Kandra NicolasJulie P DegeleMD 08/04/2017, 11:53 AM

## 2017-08-04 NOTE — Clinical Social Work Maternal (Addendum)
CLINICAL SOCIAL WORK MATERNAL/CHILD NOTE  Patient Details  Name: Debra Malone MRN: [id number] Date of Birth: 1990/10/15  Date:  08/04/2017  Clinical Social Worker Initiating Note:  Terri Piedra, [place] Date/Time: Initiated:  08/04/17/1230     Child's Name:  Debra Malone   Biological Parents:  Mother, Father(Debra Malone and Debra Malone)   Need for Interpreter:  None   Reason for Referral:  Current Substance Use/Substance Use During Pregnancy    Address:  [place] [address]    Phone number:  [phone] (home)     Additional phone number:  Household Members/Support Persons (HM/SP):      HM/SP Name Relationship DOB or Age  HM/SP -1 Debra Malone FOB 01/12/94  HM/SP -2 Debra Malone son 04/14/15  HM/SP -3        HM/SP -4        HM/SP -5        HM/SP -6        HM/SP -7        HM/SP -8          Natural Supports (not living in the home):  Parent, Immediate Family, Other (Comment)(Parents state they have a good support system of family and friends who live nearby.)  Professional Supports: None   Employment:     Type of Work: 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  FOB makes furniture.   Education:      Homebound arranged:    Financial Resources:  Medicaid   Other Resources:      Cultural/Religious Considerations Which May Impact Care: None stated.  Strengths:  Ability to meet basic needs , Home prepared for child , Pediatrician chosen   Psychotropic Medications:         Pediatrician:    [facility] (including [place] and surronding areas)  Pediatrician List:   [facility] [place]    [facility] Dr. Beatriz Chancellor      Pediatrician Fax Number:    Risk Factors/Current Problems:  Substance Use    Cognitive State:  Able to Concentrate , Alert , Linear Thinking , Goal Oriented , Insightful  Mood/Affect:  Calm , Interested ,  Euthymic  CSW Assessment: CSW received consult for hx of cocaine and marijuana use.  CSW sees cocaine documented in MOB's H&P note, but does not see a positive screen on file.  CSW notes a positive screen for marijuana on 03/30/17.  MOB was negative for cocaine this day.  MOB was negative for all substances on admission for delivery.  4 UDS was not collected and CDS is pending. CSW met with MOB in her first floor room/140 to offer support and complete assessment.  MOB was tending to baby when CSW arrived and bonding appears evident in the way she cared for her.  MOB presented in good spirits and was easy to engage.  She reports that she is feeling good and that labor and delivery went well.  MOB gave permission to speak openly with FOB when he returns to the room, as she stated that he had just stepped out for a moment.   MOB reports that she and FOB have one other child at home, Debra Malone, who turned 2 in February.  She reports that he was born in [place] and that he spent about 1.5 months in the NICU after being born at 62 weeks.  Both parents  described this experience as emotionally difficult.  MOB states she felt "depressed," but doesn't think she would have felt this way if baby had not been in the NICU.  She states no concerning mental health symptoms after baby came home.  Parents were attentive to information given regarding PMADs.   MOB admits to smoking marijuana prior to finding out that she was pregnant and states that she stopped with positive UPT.  CSW inquired about cocaine use noted in her chart and she denies ever using cocaine.  She reports she does not know where this hx came from.  She denies all other substance use other than THC as well.  She denies hx of CPS involvement with her first child.  She was understanding of hospital drug screen policy and mandated reporting for positive screens.    CSW Plan/Description: No further intervention required/No barriers to discharge, Sudden  Infant Death Syndrome (SIDS) Education, Perinatal Mood and Anxiety Disorder (PMADs) Education, [place], CSW Will Continue to Monitor Umbilical Cord Tissue Drug Screen Results and Make Report if Debra Malone 08/04/2017, 3:48 PM

## 2017-08-05 MED ORDER — IBUPROFEN 600 MG PO TABS: 600.0000 mg | ORAL_TABLET | Freq: Four times a day (QID) | ORAL | 0 refills | Status: DC | PRN

## 2017-08-05 NOTE — Discharge Instructions (Signed)
NO SEX UNTIL AFTER YOU GET YOUR BIRTH CONTROL   Postpartum Care After Vaginal Delivery The period of time right after you deliver your newborn is called the postpartum period. What kind of medical care will I receive?  You may continue to receive fluids and medicines through an IV tube inserted into one of your veins.  If an incision was made near your vagina (episiotomy) or if you had some vaginal tearing during delivery, cold compresses may be placed on your episiotomy or your tear. This helps to reduce pain and swelling.  You may be given a squirt bottle to use when you go to the bathroom. You may use this until you are comfortable wiping as usual. To use the squirt bottle, follow these steps: ? Before you urinate, fill the squirt bottle with warm water. Do not use hot water. ? After you urinate, while you are sitting on the toilet, use the squirt bottle to rinse the area around your urethra and vaginal opening. This rinses away any urine and blood. ? You may do this instead of wiping. As you start healing, you may use the squirt bottle before wiping yourself. Make sure to wipe gently. ? Fill the squirt bottle with clean water every time you use the bathroom.  You will be given sanitary pads to wear. How can I expect to feel?  You may not feel the need to urinate for several hours after delivery.  You will have some soreness and pain in your abdomen and vagina.  If you are breastfeeding, you may have uterine contractions every time you breastfeed for up to several weeks postpartum. Uterine contractions help your uterus return to its normal size.  It is normal to have vaginal bleeding (lochia) after delivery. The amount and appearance of lochia is often similar to a menstrual period in the first week after delivery. It will gradually decrease over the next few weeks to a dry, yellow-brown discharge. For most women, lochia stops completely by 6-8 weeks after delivery. Vaginal bleeding can  vary from woman to woman.  Within the first few days after delivery, you may have breast engorgement. This is when your breasts feel heavy, full, and uncomfortable. Your breasts may also throb and feel hard, tightly stretched, warm, and tender. After this occurs, you may have milk leaking from your breasts.Your health care provider can help you relieve discomfort due to breast engorgement. Breast engorgement should go away within a few days.  You may feel more sad or worried than normal due to hormonal changes after delivery. These feelings should not last more than a few days. If these feelings do not go away after several days, speak with your health care provider. How should I care for myself?  Tell your health care provider if you have pain or discomfort.  Drink enough water to keep your urine clear or pale yellow.  Wash your hands thoroughly with soap and water for at least 20 seconds after changing your sanitary pads, after using the toilet, and before holding or feeding your baby.  If you are not breastfeeding, avoid touching your breasts a lot. Doing this can make your breasts produce more milk.  If you become weak or lightheaded, or you feel like you might faint, ask for help before: ? Getting out of bed. ? Showering.  Change your sanitary pads frequently. Watch for any changes in your flow, such as a sudden increase in volume, a change in color, the passing of large blood clots.  clots. If you pass a blood clot from your vagina, save it to show to your health care provider. Do not flush blood clots down the toilet without having your health care provider look at them. °· Make sure that all your vaccinations are up to date. This can help protect you and your baby from getting certain diseases. You may need to have immunizations done before you leave the hospital. °· If desired, talk with your health care provider about methods of family planning or birth control (contraception). °How can I start  bonding with my baby? °Spending as much time as possible with your baby is very important. During this time, you and your baby can get to know each other and develop a bond. Having your baby stay with you in your room (rooming in) can give you time to get to know your baby. Rooming in can also help you become comfortable caring for your baby. Breastfeeding can also help you bond with your baby. °How can I plan for returning home with my baby? °· Make sure that you have a car seat installed in your vehicle. °? Your car seat should be checked by a certified car seat installer to make sure that it is installed safely. °? Make sure that your baby fits into the car seat safely. °· Ask your health care provider any questions you have about caring for yourself or your baby. Make sure that you are able to contact your health care provider with any questions after leaving the hospital. °This information is not intended to replace advice given to you by your health care provider. Make sure you discuss any questions you have with your health care provider. °Document Released: 12/15/2006 Document Revised: 07/23/2015 Document Reviewed: 01/22/2015 °Elsevier Interactive Patient Education © 2018 [employer] ° °

## 2017-08-05 NOTE — Discharge Summary (Signed)
OB Discharge Summary     Patient Name: Debra Malone DOB: 28-Nov-1990 MRN: [id number]  Date of admission: 08/02/2017 Delivering MD: Frederik Pear   Date of discharge: 08/05/2017  Admitting diagnosis: 36WKS,PAIN Intrauterine pregnancy: [redacted]w[redacted]d     Secondary diagnosis:  Active Problems:   Indication for care in labor and delivery, antepartum  Additional problems: none     Discharge diagnosis: Preterm Pregnancy Delivered                                                                                                Post partum procedures:none  Augmentation: none  Complications: None  Hospital course:  Onset of Labor With Vaginal Delivery     27 y.o. yo [phone] at [redacted]w[redacted]d was admitted in Active Labor on 08/02/2017. Patient had an uncomplicated labor course as follows:  Membrane Rupture Time/Date: 9:30 AM ,08/03/2017   Intrapartum Procedures: Episiotomy: None [1]                                         Lacerations:  Labial [10]  Patient had a delivery of a Viable infant. 08/03/2017  Information for the patient's newborn:  Kayleanna, Lorman [[id number]]  Delivery Method: Vaginal, Spontaneous(Filed from Delivery Summary)    Pateint had an uncomplicated postpartum course.  She is ambulating, tolerating a regular diet, passing flatus, and urinating well. Patient is discharged home in stable condition on 08/05/17.   Physical exam  Vitals:   08/03/17 1710 08/04/17 0616 08/04/17 1740 08/05/17 0554  BP: 112/65 102/60 123/76 106/65  Pulse: 99 90 83 79  Resp: 18 16 18 18   Temp: 98.5 F (36.9 C) 98.6 F (37 C) 99 F (37.2 C) 98.5 F (36.9 C)  TempSrc: Oral Oral Oral   SpO2:      Weight:      Height:       General: alert, cooperative and no distress Lochia: appropriate Uterine Fundus: firm Incision: N/A DVT Evaluation: No evidence of DVT seen on physical exam. Negative Homan's sign. No cords or calf tenderness. No significant calf/ankle edema. Labs: Lab Results   Component Value Date   WBC 17.3 (H) 08/02/2017   HGB 12.8 08/02/2017   HCT 39.7 08/02/2017   MCV 89.0 08/02/2017   PLT 197 08/02/2017   No flowsheet data found.  Discharge instruction: per After Visit Summary and "Baby and Me Booklet".  After visit meds:  Allergies as of 08/05/2017   No Known Allergies     Medication List    STOP taking these medications   prenatal multivitamin Tabs tablet     TAKE these medications   ibuprofen 600 MG tablet Commonly known as:  ADVIL,MOTRIN Take 1 tablet (600 mg total) by mouth every 6 (six) hours as needed for mild pain, moderate pain or cramping.       Diet: routine diet  Activity: Advance as tolerated. Pelvic rest for 6 weeks.   Outpatient follow up:6 weeks Follow up Appt: Future Appointments  Date Time  Provider Department Center  08/31/2017 11:00 AM Constant, Gigi GinPeggy, MD [facility] None   Follow up Visit:No follow-ups on file.  Postpartum contraception: abstinence until IUD  Newborn Data: Live born female  Birth Weight: 5 lb 6.2 oz (2445 g) APGAR: 9, 9  Newborn Delivery   Birth date/time:  08/03/2017 10:28:00 Delivery type:  Vaginal, Spontaneous     Baby Feeding: Bottle Disposition:home with mother   08/05/2017 Cheral MarkerKimberly R Novalyn Lajara, CNM

## 2017-08-11 ENCOUNTER — Ambulatory Visit: Payer: Medicaid Other

## 2017-08-27 ENCOUNTER — Inpatient Hospital Stay (HOSPITAL_COMMUNITY)
Admission: AD | Admit: 2017-08-27 | Discharge: 2017-08-27 | Disposition: A | Discharge status: Home / Self Care | Payer: Medicaid Other | Source: Ambulatory Visit | Attending: Obstetrics and Gynecology | Admitting: Obstetrics and Gynecology

## 2017-08-27 ENCOUNTER — Other Ambulatory Visit: Payer: Self-pay

## 2017-08-27 ENCOUNTER — Telehealth: Payer: Self-pay

## 2017-08-27 ENCOUNTER — Encounter (HOSPITAL_COMMUNITY): Payer: Self-pay | Admitting: *Deleted

## 2017-08-27 DIAGNOSIS — Z87891 Personal history of nicotine dependence: Secondary | ICD-10-CM | POA: Insufficient documentation

## 2017-08-27 DIAGNOSIS — O09213 Supervision of pregnancy with history of pre-term labor, third trimester: Secondary | ICD-10-CM | POA: Diagnosis not present

## 2017-08-27 DIAGNOSIS — R0781 Pleurodynia: Secondary | ICD-10-CM | POA: Insufficient documentation

## 2017-08-27 DIAGNOSIS — O26893 Other specified pregnancy related conditions, third trimester: Secondary | ICD-10-CM | POA: Diagnosis present

## 2017-08-27 DIAGNOSIS — O212 Late vomiting of pregnancy: Secondary | ICD-10-CM | POA: Insufficient documentation

## 2017-08-27 DIAGNOSIS — O99891 Other specified diseases and conditions complicating pregnancy: Secondary | ICD-10-CM

## 2017-08-27 DIAGNOSIS — Z3A36 36 weeks gestation of pregnancy: Secondary | ICD-10-CM | POA: Insufficient documentation

## 2017-08-27 DIAGNOSIS — M549 Dorsalgia, unspecified: Secondary | ICD-10-CM | POA: Diagnosis present

## 2017-08-27 DIAGNOSIS — O9989 Other specified diseases and conditions complicating pregnancy, childbirth and the puerperium: Secondary | ICD-10-CM

## 2017-08-27 LAB — URINALYSIS, ROUTINE W REFLEX MICROSCOPIC
Bilirubin Urine: NEGATIVE
Glucose, UA: NEGATIVE mg/dL
Ketones, ur: 15 mg/dL — AB
Leukocytes, UA: NEGATIVE
Nitrite: NEGATIVE
Protein, ur: NEGATIVE mg/dL
Specific Gravity, Urine: >1.03 — ABNORMAL HIGH (ref 1.005–1.030)
pH: 6 (ref 5.0–8.0)

## 2017-08-27 LAB — URINALYSIS, MICROSCOPIC (REFLEX)

## 2017-08-27 MED ORDER — KETOROLAC TROMETHAMINE 60 MG/2ML IM SOLN
60.0000 mg | Freq: Once | INTRAMUSCULAR | Status: AC
  Administered 2017-08-27: 60 mg via INTRAMUSCULAR
  Filled 2017-08-27: qty 2

## 2017-08-27 MED ORDER — CYCLOBENZAPRINE HCL 10 MG PO TABS: 10.0000 mg | ORAL_TABLET | Freq: Two times a day (BID) | ORAL | 0 refills | Status: DC | PRN

## 2017-08-27 MED ORDER — CYCLOBENZAPRINE HCL 10 MG PO TABS
10.0000 mg | ORAL_TABLET | Freq: Once | ORAL | Status: AC
  Administered 2017-08-27: 10 mg via ORAL
  Filled 2017-08-27: qty 1

## 2017-08-27 MED ORDER — LACTATED RINGERS IV BOLUS
1000.0000 mL | Freq: Once | INTRAVENOUS | Status: AC
  Administered 2017-08-27: 1000 mL via INTRAVENOUS

## 2017-08-27 NOTE — Telephone Encounter (Signed)
Returned call, patient states that for the last few days she has been having nausea, vomiting, and bad back pain. Patient had vaginal delivery on 08-03-17, [facility]

## 2017-08-27 NOTE — MAU Provider Note (Addendum)
History     CSN: [id number]  Arrival date and time: 08/27/17 1501   First Provider Initiated Contact with Patient 08/27/17 1537      Chief Complaint  Patient presents with  . Chest Pain  . Back Pain   Debra Malone is a 27 y.o. [id number] at 24 days s/p NSVD at [redacted]w[redacted]d presenting with mid upper back pain and midsternal and right lower rib pain.  She characterizes the pain as crampy in mid-chest and sharp at right costal margin. It is not pleuritic and not associated with SOB. She is uncertain if CP radiates to back or vice versa. Her back pain is worse than the chest pain and described as stabbing pain.  She first noted this pain while still in the hospital postpartum and states that it has waxed and waned since then.  Yesterday she had strenuous vomiting and pain has been worse since then.  Denies nausea at present. She has tried Motrin 600mg  and extra strength Tylenol without relief.  She also took a friend's muscle relaxant yesterday with no effect.  Appetite is decreased and reports poor fluid intake today.  Bottlefeeding.    OB History  Gravida Para Term Preterm AB Living  4 3 0 3 1 2   SAB TAB Ectopic Multiple Live Births  1 0 0 0 3    # Outcome Date GA Lbr Len/2nd Weight Sex Delivery Anes PTL Lv  4 Preterm 08/03/17 [redacted]w[redacted]d 08:24 / 00:12 5 lb 6.2 oz (2.445 kg) F Vag-Spont EPI  LIV     Birth Comments: Left forearm nodule/bruise noted  3 Preterm 04/14/15 [redacted]w[redacted]d  3 lb (1.361 kg) M Vag-Spont   LIV     Complications: Preterm premature rupture of membranes (PPROM) with onset of labor after 24 hours of rupture in first trimester, antepartum  2 Preterm 07/22/13 [redacted]w[redacted]d  13 oz (0.369 kg)  Vag-Spont   ND  1 SAB  [redacted]w[redacted]d            Past Medical History:  Diagnosis Date  . History of preterm delivery, currently pregnant 04/14/2015   30 weeks    Past Surgical History:  Procedure Laterality Date  . NO PAST SURGERIES      History reviewed. No pertinent family history.  Social History    Tobacco Use  . Smoking status: Former Games developer  . Smokeless tobacco: Never Used  . Tobacco comment: marijuana, stop with preg  Substance Use Topics  . Alcohol use: No    Frequency: Never  . Drug use: Yes    Types: Marijuana    Comment: stopped when confirmed pregnancy    Allergies: No Known Allergies  Medications Prior to Admission  Medication Sig Dispense Refill Last Dose  . ibuprofen (ADVIL,MOTRIN) 600 MG tablet Take 1 tablet (600 mg total) by mouth every 6 (six) hours as needed for mild pain, moderate pain or cramping. 30 tablet 0     Review of Systems  Constitutional: Positive for appetite change and fatigue. Negative for activity change, chills, diaphoresis and fever.  HENT: Negative for congestion.   Respiratory: Negative for cough, chest tightness and wheezing.   Cardiovascular: Positive for chest pain. Negative for palpitations.  Gastrointestinal: Positive for nausea and vomiting. Negative for abdominal pain, constipation and diarrhea.  Genitourinary: Positive for decreased urine volume and vaginal bleeding. Negative for flank pain, hematuria and urgency.       Small amount brownish lochia  Musculoskeletal: Negative for back pain () and neck pain.  Neurological: Negative  for dizziness and headaches.  Psychiatric/Behavioral: Negative for agitation. The patient is not nervous/anxious.    Physical Exam   Blood pressure 121/81, pulse 90, temperature 98 F (36.7 C), temperature source Oral, resp. rate 20, height 5\' 3"  (1.6 m), weight 129 lb 4 oz (58.6 kg), last menstrual period 11/21/2016, SpO2 100 %, unknown if currently breastfeeding.  Physical Exam  Nursing note and vitals reviewed. Constitutional: She is oriented to person, place, and time. She appears well-developed and well-nourished.  Looks uncomfortable  HENT:  Head: Normocephalic.  Eyes: No scleral icterus.  Neck: Neck supple. No thyromegaly present.  Cardiovascular: Normal rate, regular rhythm and normal  heart sounds.  Respiratory: Effort normal and breath sounds normal. She exhibits tenderness.  TTP localized mid-anterior right costal margin and mid-sternum  Musculoskeletal: She exhibits tenderness.  Cervical and thoracid bilateral paraspinous muscle tenderness  Neurological: She is alert and oriented to person, place, and time.  Skin: Skin is warm and dry.  Paraspnopus TTP upper to mid back  MAU Course  Procedures  Paraspinous massage upper back> improved pain Flexeril 10mg  po given.  Results for orders placed or performed during the hospital encounter of 08/27/17 (from the past 24 hour(s))  Urinalysis, Routine w reflex microscopic     Status: Abnormal   Collection Time: 08/27/17  4:08 PM  Result Value Ref Range   Color, Urine YELLOW YELLOW   APPearance CLEAR CLEAR   Specific Gravity, Urine >1.030 (H) 1.005 - 1.030   pH 6.0 5.0 - 8.0   Glucose, UA NEGATIVE NEGATIVE mg/dL   Hgb urine dipstick TRACE (A) NEGATIVE   Bilirubin Urine NEGATIVE NEGATIVE   Ketones, ur 15 (A) NEGATIVE mg/dL   Protein, ur NEGATIVE NEGATIVE mg/dL   Nitrite NEGATIVE NEGATIVE   Leukocytes, UA NEGATIVE NEGATIVE  Urinalysis, Microscopic (reflex)     Status: Abnormal   Collection Time: 08/27/17  4:08 PM  Result Value Ref Range   RBC / HPF 0-5 0 - 5 RBC/hpf   WBC, UA 0-5 0 - 5 WBC/hpf   Bacteria, UA MANY (A) NONE SEEN   Squamous Epithelial / LPF 6-10 0 - 5   Urine-Other LESS THAN 10 mL OF URINE SUBMITTED   Urine culture sent  Flexeril 10mg  po given, initially had some relief 1700: back pain severe> will give Toradol and IV rehydration with LR 1000  1850: Pain is much improved Will discharge home with reassurance. General relief measures reviewed.    Assessment and Plan   1. Back pain affecting pregnancy in third trimester    Allergies as of 08/27/2017   No Known Allergies     Medication List    STOP taking these medications   ibuprofen 600 MG tablet Commonly known as:  ADVIL,MOTRIN      TAKE these medications   cyclobenzaprine 10 MG tablet Commonly known as:  FLEXERIL Take 1 tablet (10 mg total) by mouth 2 (two) times daily as needed for muscle spasms.     May also use acetaminophen 1000mg  po up to 4 gm/24 hr.   Follow-up Information    [facility]   Specialty:  Obstetrics and Gynecology Why:  Keep your 6 week postpartum appointment Contact information: [address] [place] [id number] [phone]          Alaycia Eardley  CNM 08/27/2017, 3:38 PM

## 2017-08-27 NOTE — MAU Note (Signed)
Pt presents with c/o mid sternal chest pain that goes below both breast.  Pt also c/o upper back pain.  Reports has taken Tylenol & Motrin without relief. Reports s/p NVD 08/03/2017 without complications.

## 2017-08-28 LAB — URINE CULTURE

## 2017-08-31 ENCOUNTER — Encounter: Payer: Self-pay | Admitting: Obstetrics and Gynecology

## 2017-08-31 ENCOUNTER — Ambulatory Visit (INDEPENDENT_AMBULATORY_CARE_PROVIDER_SITE_OTHER): Payer: Medicaid Other | Admitting: Obstetrics and Gynecology

## 2017-08-31 ENCOUNTER — Encounter: Payer: Self-pay | Admitting: *Deleted

## 2017-08-31 DIAGNOSIS — Z1389 Encounter for screening for other disorder: Secondary | ICD-10-CM

## 2017-08-31 DIAGNOSIS — Z30430 Encounter for insertion of intrauterine contraceptive device: Secondary | ICD-10-CM

## 2017-08-31 DIAGNOSIS — Z3202 Encounter for pregnancy test, result negative: Secondary | ICD-10-CM | POA: Diagnosis not present

## 2017-08-31 LAB — POCT URINE PREGNANCY: Preg Test, Ur: NEGATIVE

## 2017-08-31 MED ORDER — LEVONORGESTREL 20 MCG/24HR IU IUD
INTRAUTERINE_SYSTEM | Freq: Once | INTRAUTERINE | Status: AC
  Administered 2017-08-31: 12:00:00 via INTRAUTERINE

## 2017-08-31 NOTE — BH Specialist Note (Deleted)
Integrated Behavioral Health Initial Visit  MRN: [id number] Name: Debra Malone  Number of Integrated Behavioral Health Clinician visits:: 1/6 Session Start time: ***  Session End time: *** Total time: {IBH Total Time:21014050}  Type of Service: Integrated Behavioral Health- Individual/Family Interpretor:No. Interpretor Name and Language: n/a   Warm Hand Off Completed.       SUBJECTIVE: Debra Malone is a 27 y.o. female accompanied by {CHL AMB ACCOMPANIED WU:[id number]}BY:[phone]} Patient was referred by Dr Jolayne Pantheronstant for symptoms of depression. Patient reports the following symptoms/concerns: Pt states her primary concern today is ***  Duration of problem: ***; Severity of problem: {Mild/Moderate/Severe:20260}  OBJECTIVE: Mood: {BHH MOOD:22306} and Affect: {BHH AFFECT:22307} Risk of harm to self or others: {CHL AMB BH Suicide Current Mental Status:21022748}  LIFE CONTEXT: Family and Social: *** School/Work: *** Self-Care: *** Life Changes: Recent childbirth ***  GOALS ADDRESSED: Patient will: 1. Reduce symptoms of: {IBH Symptoms:21014056} 2. Increase knowledge and/or ability of: {IBH Patient Tools:21014057}  3. Demonstrate ability to: {IBH Goals:21014053}  INTERVENTIONS: Interventions utilized: {IBH Interventions:21014054}  Standardized Assessments completed: {IBH Screening Tools:21014051}  ASSESSMENT: Patient currently experiencing ***.   Patient may benefit from psychoeducation and brief therapeutic interventions regarding coping with symptoms of *** .  PLAN: 1. Follow up with behavioral health clinician on : *** 2. Behavioral recommendations:  -*** -*** -Read educational materials regarding coping with symptoms of ***  3. Referral(s): {IBH Referrals:21014055} 4. "From scale of 1-10, how likely are you to follow plan?": ***  Rae LipsJamie C McMannes, LCSW  Edinburgh Postnatal Depression Scale - 08/31/17 1104      Edinburgh Postnatal Depression Scale:  In the  Past 7 Days   I have been able to laugh and see the funny side of things.  0    I have looked forward with enjoyment to things.  0    I have blamed myself unnecessarily when things went wrong.  0    I have been anxious or worried for no good reason.  2    I have felt scared or panicky for no good reason.  0    Things have been getting on top of me.  2    I have been so unhappy that I have had difficulty sleeping.  1    I have felt sad or miserable.  1    I have been so unhappy that I have been crying.  1    The thought of harming myself has occurred to me.  0    Edinburgh Postnatal Depression Scale Total  7      No flowsheet data found.  No flowsheet data found.

## 2017-08-31 NOTE — Progress Notes (Signed)
..  Post Partum Exam  Alfonso EllisRayshona Dora SimsM Mask is a 27 y.o. [phone] female who presents for a postpartum visit. She is 4 weeks postpartum following a spontaneous vaginal delivery. I have fully reviewed the prenatal and intrapartum course. The delivery was at 36.3 gestational weeks.  Anesthesia: epidural. Postpartum course has been complicated by stress. Baby's course has been good. Baby is feeding by bottle - Similac Neosure. Bleeding no bleeding. Bowel function is normal. Bladder function is normal. Patient is not sexually active. Contraception method is none. Postpartum depression screening:neg Patient reports receiving ample support from mother and father of the baby.   Last pap smear done 01/2017 and was Normal  Review of Systems Pertinent items are noted in HPI.    Objective:  Blood pressure 120/79, pulse 60, height 5\' 3"  (1.6 m), weight 134 lb 1.6 oz (60.8 kg), last menstrual period 11/21/2016, unknown if currently breastfeeding.  General:  alert, cooperative and no distress   Breasts:  inspection negative, no nipple discharge or bleeding, no masses or nodularity palpable  Lungs: clear to auscultation bilaterally  Heart:  regular rate and rhythm  Abdomen: soft, non-tender; bowel sounds normal; no masses,  no organomegaly   Vulva:  normal  Vagina: normal vagina, no discharge, exudate, lesion, or erythema  Cervix:  multiparous appearance  Corpus: normal size, contour, position, consistency, mobility, non-tender  Adnexa:  no mass, fullness, tenderness  Rectal Exam: Not performed.        Assessment:    Normal postpartum exam. Pap smear not done at today's visit.   Plan:   1. Contraception: IUD  IUD Procedure Note Patient identified, informed consent performed, signed copy in chart, time out was performed.  Urine pregnancy test negative.  Speculum placed in the vagina.  Cervix visualized.  Cleaned with Betadine x 2.  Grasped anteriorly with a single tooth tenaculum.  Uterus sounded to 8  cm.  Mirena IUD placed per manufacturer's recommendations.  Strings trimmed to 3 cm. Tenaculum was removed, good hemostasis noted.  Patient tolerated procedure well.   Patient given post procedure instructions and Mirena care card with expiration date.  Patient is asked to check IUD strings periodically and follow up in 4-6 weeks for IUD check.   2. Patient is medically cleared to resume all activities of daily living. Patient admits to having some crying spells at time. I referred her to see Asher MuirJamie (behavioral health specialist) 3. Follow up in: 4 weeks for IUD check or as needed.

## 2017-09-01 ENCOUNTER — Institutional Professional Consult (permissible substitution): Payer: Medicaid Other

## 2017-09-14 ENCOUNTER — Institutional Professional Consult (permissible substitution): Payer: Medicaid Other

## 2017-09-28 ENCOUNTER — Ambulatory Visit: Payer: Medicaid Other | Admitting: Obstetrics and Gynecology

## 2017-10-06 ENCOUNTER — Ambulatory Visit (INDEPENDENT_AMBULATORY_CARE_PROVIDER_SITE_OTHER): Payer: Medicaid Other | Admitting: Obstetrics and Gynecology

## 2017-10-06 ENCOUNTER — Encounter: Payer: Self-pay | Admitting: Obstetrics and Gynecology

## 2017-10-06 VITALS — BP 126/73 | HR 88 | Ht 63.0 in | Wt 135.7 lb

## 2017-10-06 DIAGNOSIS — Z30431 Encounter for routine checking of intrauterine contraceptive device: Secondary | ICD-10-CM | POA: Diagnosis not present

## 2017-10-06 NOTE — Progress Notes (Signed)
   IUD Insertion Follow Up   Patient is 27 y.o. [id number] who is here for string check/followup after insertion of Mirena IUD 4 weeks ago. She is doing well, had minor pain after procedure that is now resolved. She had minimal spotting after procedure. Denies pain or other issues.  BP 126/73   Pulse 88   Ht 5\' 3"  (1.6 m)   Wt 135 lb 11.2 oz (61.6 kg)   LMP 11/21/2016 (Approximate)   Breastfeeding? No   BMI 24.04 kg/m   Gen: alert, oriented Abd: soft, non-tender GU: normal appearing vaginal mucosa, normal appearing cervix with blue strings protruding 3 cm from cervix. Strings not trimmed.    Normal post IUD insertion check. Return 1 year for annual or prn    K. Therese SarahMeryl Davis, M.D. [facility]

## 2017-10-06 NOTE — Progress Notes (Signed)
Patient is in the office for IUD follow up, inserted 08-31-17.

## 2018-09-15 ENCOUNTER — Ambulatory Visit: Payer: Medicaid Other | Admitting: Advanced Practice Midwife

## 2018-09-29 ENCOUNTER — Ambulatory Visit: Payer: Medicaid Other | Admitting: Certified Nurse Midwife

## 2018-10-11 ENCOUNTER — Other Ambulatory Visit: Payer: Self-pay

## 2018-10-11 ENCOUNTER — Encounter: Payer: Self-pay | Admitting: Nurse Practitioner

## 2018-10-11 ENCOUNTER — Ambulatory Visit (INDEPENDENT_AMBULATORY_CARE_PROVIDER_SITE_OTHER): Payer: Medicaid Other | Admitting: Nurse Practitioner

## 2018-10-11 VITALS — BP 132/85 | HR 74 | Wt 129.0 lb

## 2018-10-11 DIAGNOSIS — Z30431 Encounter for routine checking of intrauterine contraceptive device: Secondary | ICD-10-CM

## 2018-10-11 DIAGNOSIS — Z975 Presence of (intrauterine) contraceptive device: Secondary | ICD-10-CM

## 2018-10-11 NOTE — Progress Notes (Signed)
   GYNECOLOGY OFFICE VISIT NOTE   History:  28 y.o. [id number] here today for IUD string check. She denies any abnormal vaginal discharge, bleeding, or other concerns.  Did have an episode of RLQ pain at work where she had to sit down and the pain eased.  Did not have vomiting and the pain has resolved currently but she was worried that something is wrong with the IUD.  Did have some dark bleeding for 2 days not like what she has had in the past with her periods.  Patient Active Problem List   Diagnosis Date Noted  . History of substance abuse ([place]) 05/04/2017  . Presence of of 52 mg levonorgestrel-releasing intrauterine device (IUD) 04/14/2015    Past Surgical History:  Procedure Laterality Date  . NO PAST SURGERIES      The following portions of the patient's history were reviewed and updated as appropriate: allergies, current medications, past family history, past medical history, past social history, past surgical history and problem list.   Health Maintenance: Pap is overdue - missed at the time of this visit.  Only noticed after patient left the office.  Message sent to admin staff to call client and have her scheduled for a pap smear.  Review of Systems:  Pertinent items noted in HPI and remainder of comprehensive ROS otherwise negative.  Objective:  Physical Exam BP 132/85   Pulse 74   Wt 129 lb (58.5 kg)   BMI 22.85 kg/m  CONSTITUTIONAL: Well-developed, well-nourished female in no acute distress.  HENT:  Normocephalic, atraumatic. External right and left ear normal.  EYES: Conjunctivae and EOM are normal. Pupils are equal, round.  No scleral icterus.  NECK: Normal range of motion, supple, no masses SKIN: Skin is warm and dry. No rash noted. Not diaphoretic. No erythema. No pallor. NEUROLOGIC: Alert and oriented to person, place, and time. Normal muscle tone coordination. No cranial nerve deficit noted. PSYCHIATRIC: Normal mood and affect. Normal behavior. Normal judgment  and thought content. ABDOMEN: Soft, no distention noted.   PELVIC: IUD strings seen and wrapping behind the cervix.  No part of the IUD seen or palpated on exam.  No abnormal vaginal discharge, No bleeding seen.  No pain with uterus or adnexa on bimanual exam.  Uterus normal size. MUSCULOSKELETAL: Normal range of motion. No edema noted.  Labs and Imaging No results found.  Assessment & Plan:  1. IUD check up Advised Motrin OTC with food for IUD pain and expect pain to decrease over the next 6 months.  If no menses, that would be normal. Message sent to admin staff to call client and have her make an appointment for pap smear.  Did not review for pap until she left the office and say it is overdue.  Expected it to have been done for PP or IUD insertion visit.  Routine preventative health maintenance measures emphasized. Please refer to After Visit Summary for other counseling recommendations.   Return in about 1 year (around 10/11/2019).   Total face-to-face time with patient: 10 minutes.  Over 50% of encounter was spent on counseling and coordination of care.  Earlie Server, RN, MSN, NP-BC Nurse Practitioner, [facility] for [employer], [place] [facility] 10/11/2018 4:44 PM

## 2018-11-10 ENCOUNTER — Ambulatory Visit: Payer: Medicaid Other | Admitting: Obstetrics and Gynecology

## 2018-11-10 ENCOUNTER — Other Ambulatory Visit (HOSPITAL_COMMUNITY)
Admission: RE | Admit: 2018-11-10 | Discharge: 2018-11-10 | Disposition: A | Discharge status: Home / Self Care | Payer: Medicaid Other | Source: Ambulatory Visit | Attending: Obstetrics and Gynecology | Admitting: Obstetrics and Gynecology

## 2018-11-10 ENCOUNTER — Encounter: Payer: Self-pay | Admitting: Obstetrics and Gynecology

## 2018-11-10 ENCOUNTER — Other Ambulatory Visit: Payer: Self-pay

## 2018-11-10 VITALS — BP 117/82 | HR 70 | Ht 64.0 in | Wt 131.7 lb

## 2018-11-10 DIAGNOSIS — Z0000 Encounter for general adult medical examination without abnormal findings: Secondary | ICD-10-CM

## 2018-11-10 DIAGNOSIS — Z01419 Encounter for gynecological examination (general) (routine) without abnormal findings: Secondary | ICD-10-CM

## 2018-11-10 NOTE — Progress Notes (Signed)
Pt presents for pap smear. Pt last pap smear was completed at [facility] clinic 01/2017 which was normal. Pt has no other concerns at this time.

## 2018-11-10 NOTE — Progress Notes (Signed)
Subjective:     Debra Malone is a 28 y.o. female P2 with BMI 22 and LMP 11/21/2016 is here for a comprehensive physical exam. The patient reports no problems. Patient is sexually active using Mirena IUD for contraception. Patient has been amenorrheic since IUD insertion following delivery in 2019. Patient denies any pelvic pain or abnormal discharge. She reports recurrent yeast infections which she self treats with over the counter monistat. She would like to ensure that she doesn't have any current infections.   History reviewed. No pertinent past medical history. Past Surgical History:  Procedure Laterality Date  . NO PAST SURGERIES     History reviewed. No pertinent family history.   Social History   Socioeconomic History  . Marital status: Single    Spouse name: Not on file  . Number of children: Not on file  . Years of education: Not on file  . Highest education level: Not on file  Occupational History  . Not on file  Social Needs  . Financial resource strain: Not on file  . Food insecurity    Worry: Not on file    Inability: Not on file  . Transportation needs    Medical: Not on file    Non-medical: Not on file  Tobacco Use  . Smoking status: Former Games developermoker  . Smokeless tobacco: Never Used  . Tobacco comment: marijuana, stop with preg  Substance and Sexual Activity  . Alcohol use: No    Frequency: Never  . Drug use: Yes    Types: Marijuana    Comment: 3x per week  . Sexual activity: Yes    Partners: Male    Birth control/protection: I.U.D.  Lifestyle  . Physical activity    Days per week: Not on file    Minutes per session: Not on file  . Stress: Not on file  Relationships  . Social Musicianconnections    Talks on phone: Not on file    Gets together: Not on file    Attends religious service: Not on file    Active member of club or organization: Not on file    Attends meetings of clubs or organizations: Not on file    Relationship status: Not on file  .  Intimate partner violence    Fear of current or ex partner: Not on file    Emotionally abused: Not on file    Physically abused: Not on file    Forced sexual activity: Not on file  Other Topics Concern  . Not on file  Social History Narrative  . Not on file   Health Maintenance  Topic Date Due  . PAP-Cervical Cytology Screening  05/12/2013  . PAP SMEAR-Modifier  05/12/2013  . INFLUENZA VACCINE  10/02/2018  . TETANUS/TDAP  07/22/2027  . HIV Screening  Completed       Review of Systems Pertinent items are noted in HPI.   Objective:  Blood pressure 117/82, pulse 70, height 5\' 4"  (1.626 m), weight 131 lb 11.2 oz (59.7 kg), not currently breastfeeding.     GENERAL: Well-developed, well-nourished female in no acute distress.  HEENT: Normocephalic, atraumatic. Sclerae anicteric.  NECK: Supple. Normal thyroid.  LUNGS: Clear to auscultation bilaterally.  HEART: Regular rate and rhythm. BREASTS: Symmetric in size. No palpable masses or lymphadenopathy, skin changes, or nipple drainage. ABDOMEN: Soft, nontender, nondistended. No organomegaly. PELVIC: Normal external female genitalia. Vagina is pink and rugated.  Normal discharge. Normal appearing cervix with IUD strings visualized extending from the os. Uterus  is normal in size. No adnexal mass or tenderness. EXTREMITIES: No cyanosis, clubbing, or edema, 2+ distal pulses.    Assessment:    Healthy female exam.      Plan:    Pap smear collected Vaginal cultures collected Patient will be contacted with abnormal results RTC in 1 year or prn See After Visit Summary for Counseling Recommendations

## 2018-11-11 LAB — CYTOLOGY - PAP: Diagnosis: NEGATIVE

## 2018-11-12 LAB — CERVICOVAGINAL ANCILLARY ONLY
Bacterial vaginitis: POSITIVE — AB
Candida vaginitis: NEGATIVE
Chlamydia: NEGATIVE
Neisseria Gonorrhea: NEGATIVE
Trichomonas: NEGATIVE

## 2018-11-15 MED ORDER — METRONIDAZOLE 500 MG PO TABS: 500.0000 mg | ORAL_TABLET | Freq: Two times a day (BID) | ORAL | 0 refills | Status: DC

## 2018-11-15 NOTE — Addendum Note (Signed)
Addended by: Mora Bellman on: 11/15/2018 08:56 AM   Modules accepted: Orders

## 2019-04-11 ENCOUNTER — Ambulatory Visit: Payer: Medicaid Other

## 2019-04-15 ENCOUNTER — Ambulatory Visit: Payer: Medicaid Other

## 2019-10-22 IMAGING — US US MFM OB TRANSVAGINAL
1 series · 14 of 28 positions shown · non-contrast
Comparison: none

[Series 1: us mfm ob transvaginal · 80 acquisitions, 14 frames shown]
[im 3/80]
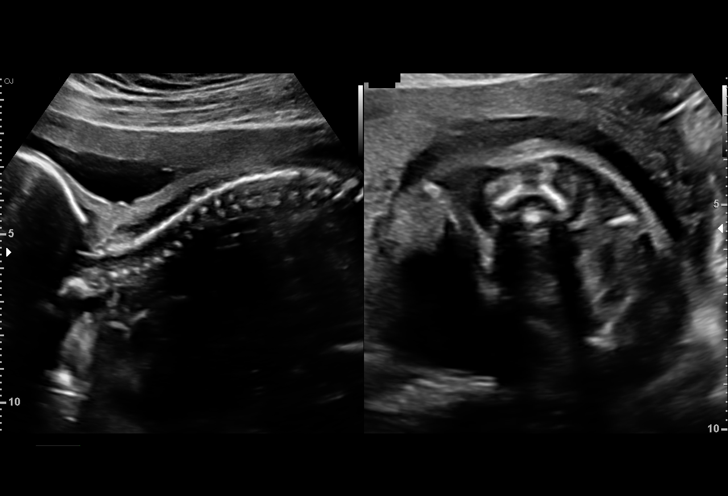
[im 9/80]
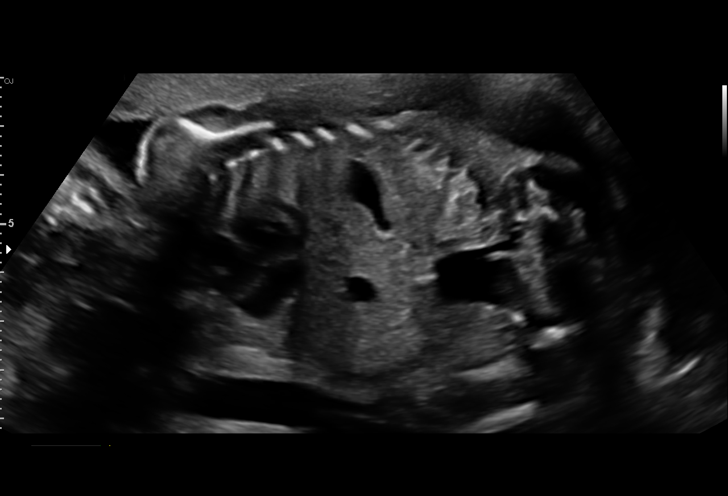
[im 15/80]
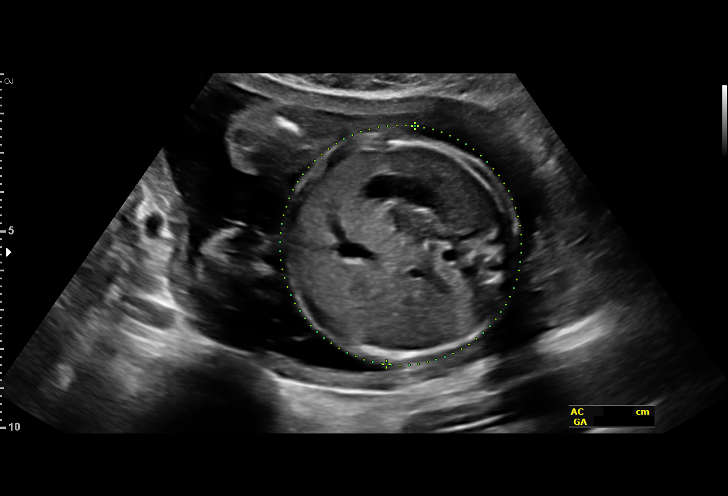
[im 21/80]
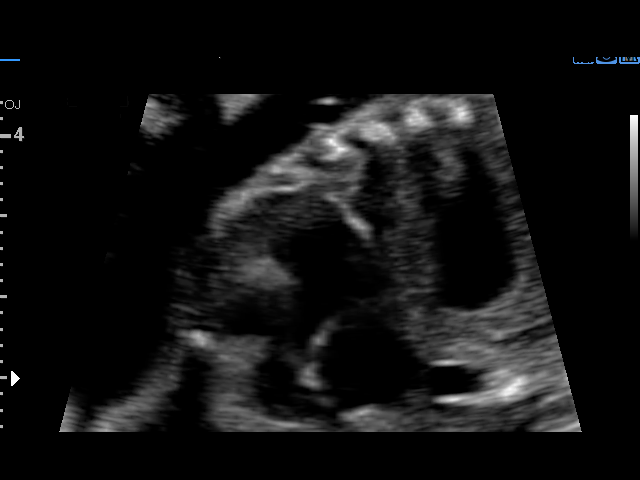
[im 27/80]
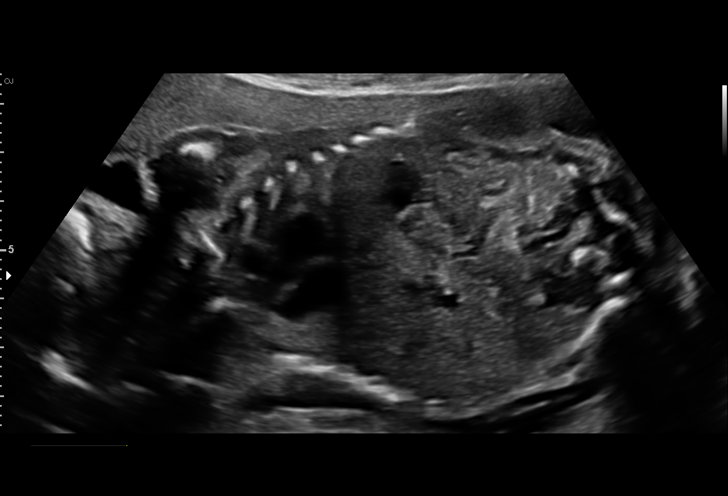
[im 33/80]
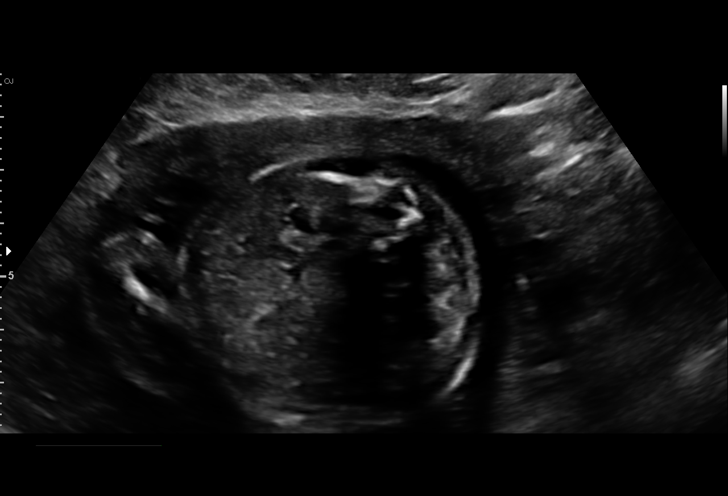
[im 39/80]
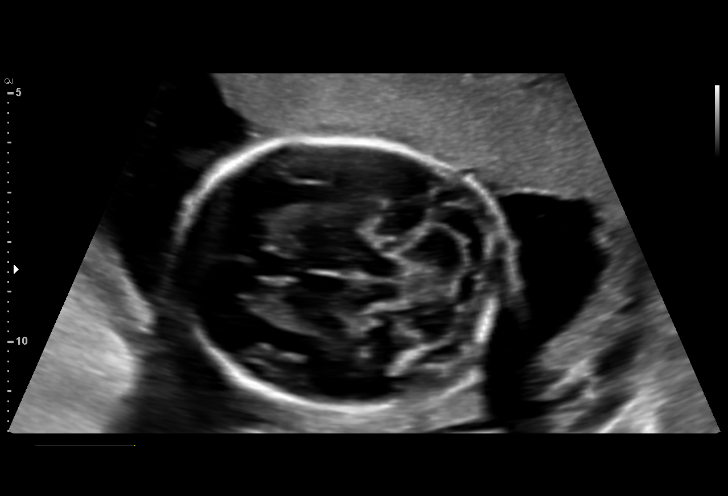
[im 44/80]
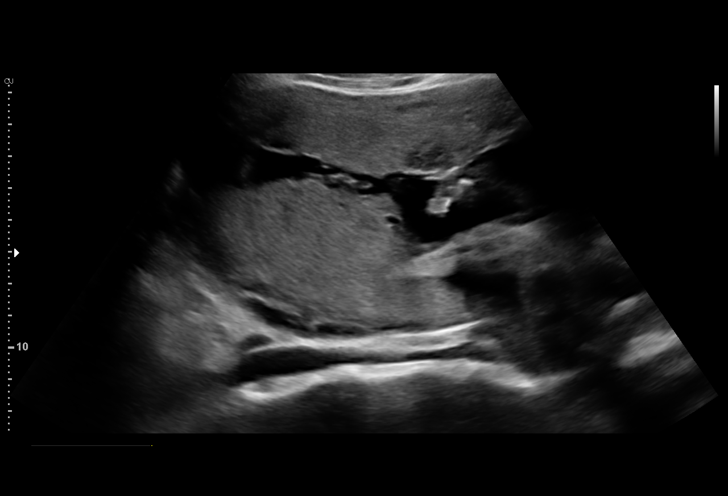
[im 50/80]
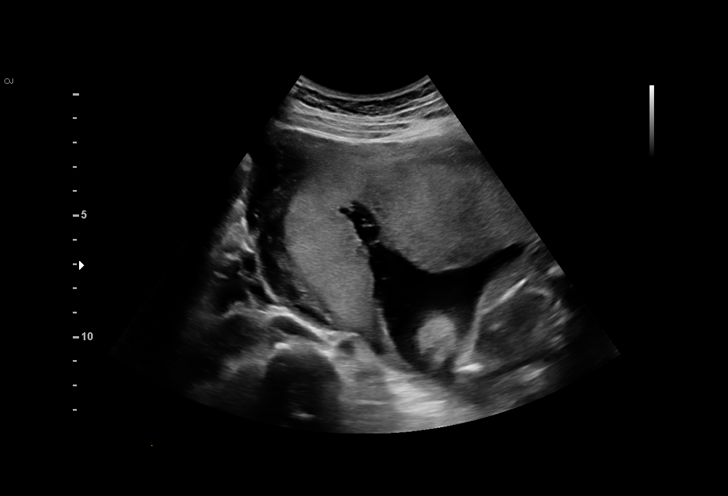
[im 56/80]
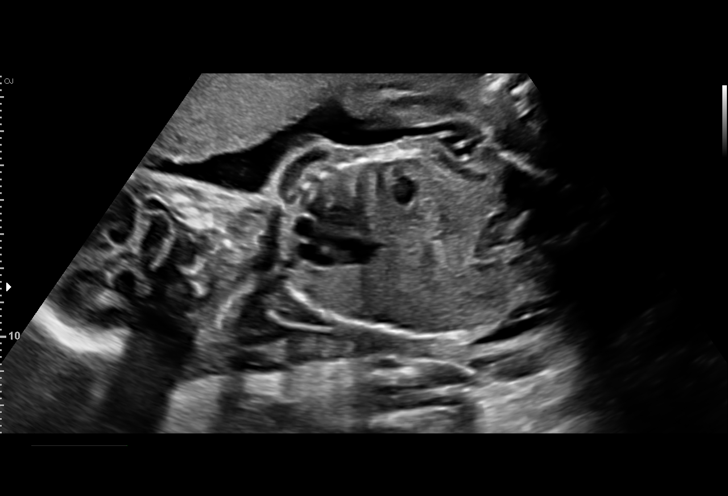
[im 62/80]
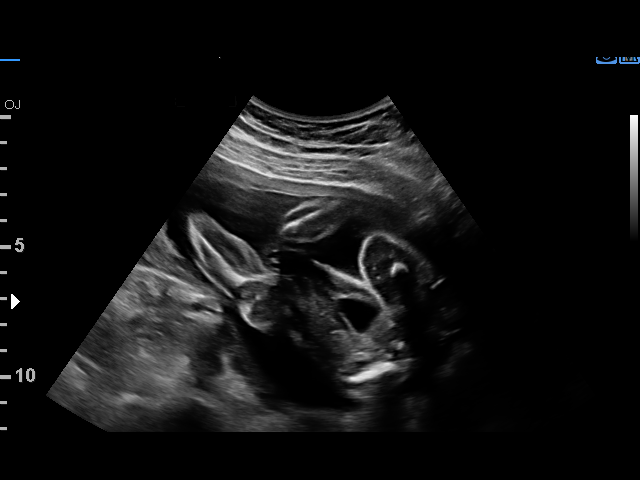
[im 68/80]
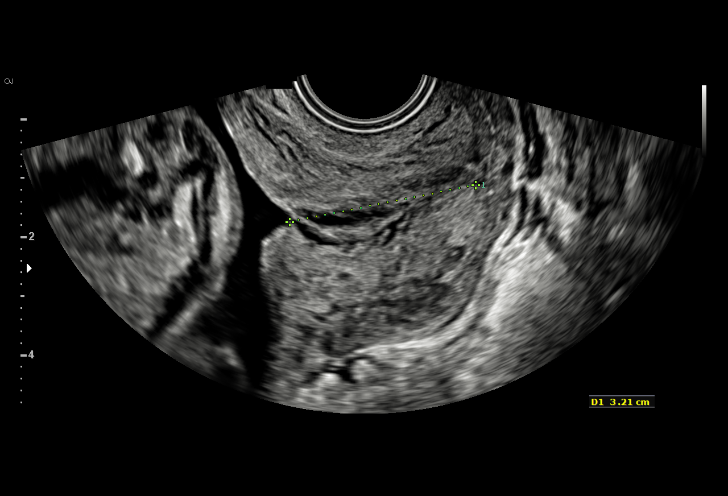
[im 74/80]
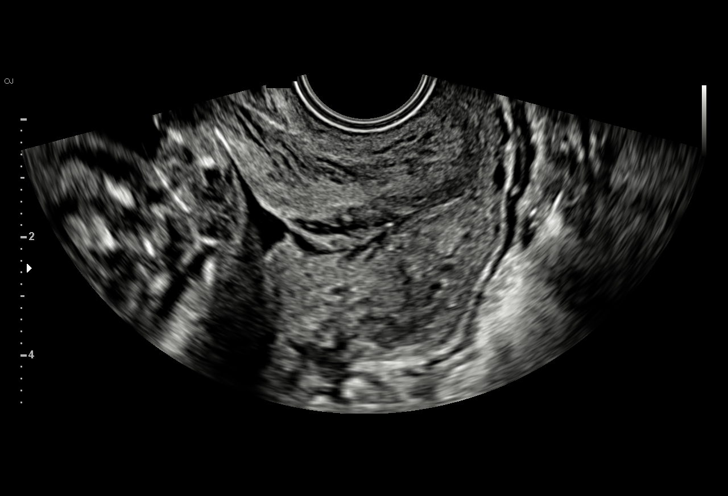
[im 80/80]
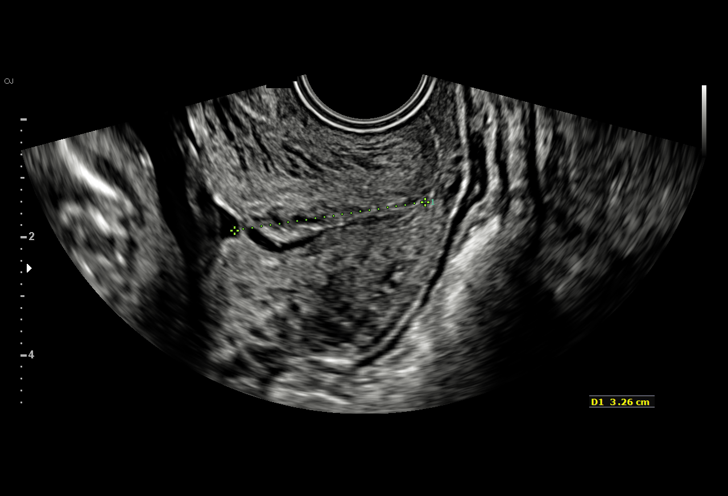

[14 of 28 positions shown; findings below may reference images not displayed]

[REDACTED]care - [HOSPITAL]

1  YURII HOLLOMAN              [id number]      [id number]     [id number]
2  YURII HOLLOMAN              [id number]      [id number]     [id number]
Indications

24 weeks gestation of pregnancy
Poor obstetric history: Previous midtrimester
loss (20 weeks s/p bleeding)
Poor obstetric history: Previous preterm
delivery, antepartum (19wks) -[facility]
Encounter for other antenatal screening
follow-up
OB History

Gravidity:    4         Term:   0        Prem:   2        SAB:   1
Living:       1
Fetal Evaluation

Num Of Fetuses:     1
Fetal Heart         162
Rate(bpm):
Cardiac Activity:   Observed
Presentation:       Breech
Placenta:           Right lateral, above cervical os
P. Cord Insertion:  Previously Visualized

Amniotic Fluid
AFI FV:      Subjectively within normal limits

Largest Pocket(cm)
4.83
Biometry

BPD:        54  mm     G. Age:  22w 3d          4  %    CI:         72.7   %    70 - 86
FL/HC:      19.0   %    18.7 -
HC:      201.4  mm     G. Age:  22w 2d        < 3  %    HC/AC:      1.04        1.05 -
AC:      192.8  mm     G. Age:  24w 0d         41  %    FL/BPD:     70.7   %    71 - 87
FL:       38.2  mm     G. Age:  22w 2d          3  %    FL/AC:      19.8   %    20 - 24
HUM:      37.7  mm     G. Age:  23w 2d         24  %
CER:        26  mm     G. Age:  24w 0d         50  %

Est. FW:     562  gm      1 lb 4 oz     32  %
Gestational Age

LMP:           24w 0d        Date:  11/21/16                 EDD:   08/28/17
U/S Today:     22w 5d                                        EDD:   09/06/17
Best:          24w 0d     Det. By:  LMP  (11/21/16)          EDD:   08/28/17
Anatomy

Cranium:               Appears normal         Aortic Arch:            Previously seen
Cavum:                 Appears normal         Ductal Arch:            Previously seen
Ventricles:            Appears normal         Diaphragm:              Previously seen
Choroid Plexus:        Previously seen        Stomach:                Appears normal, left
sided
Cerebellum:            Previously seen        Abdomen:                Appears normal
Posterior Fossa:       Previously seen        Abdominal Wall:         Previously seen
Nuchal Fold:           Not applicable (>20    Cord Vessels:           Appears normal (3
wks GA)                                        vessel cord)
Face:                  Orbits and profile     Kidneys:                Appear normal
previously seen
Lips:                  Previously seen        Bladder:                Appears normal
Thoracic:              Appears normal         Spine:                  Appears normal
Heart:                 Appears normal         Upper Extremities:      Previously seen
(4CH, axis, and situs
RVOT:                  Appears normal         Lower Extremities:      Previously seen
LVOT:                  Appears normal

Other:  Female gender. Heels and 5th digit visualized.
Cervix Uterus Adnexa

Cervix
Length:            3.2  cm.
Small amount of funneling visualized.

Uterus
No abnormality visualized.

Left Ovary
Not visualized.

Right Ovary
Not visualized.

Cul De Sac:   No free fluid seen.

Adnexa:       No adnexal mass visualized.
Impression

SIUP at 24+0 weeks
Normal interval anatomy; anatomic survey complete
Normal amniotic fluid volume
Appropriate interval growth with EFW at the 32nd %tile; AC at
the 41st %tile
EV views of cervix: normal length (3.2 cms) with minimal
funneling
Recommendations

Continue weekly 17P injections
Follow-up as clinically indicated

## 2020-03-15 ENCOUNTER — Other Ambulatory Visit (HOSPITAL_COMMUNITY)
Admission: RE | Admit: 2020-03-15 | Discharge: 2020-03-15 | Disposition: A | Discharge status: Home / Self Care | Payer: Medicaid Other | Source: Ambulatory Visit | Attending: Obstetrics and Gynecology | Admitting: Obstetrics and Gynecology

## 2020-03-15 ENCOUNTER — Encounter: Payer: Self-pay | Admitting: Obstetrics and Gynecology

## 2020-03-15 ENCOUNTER — Other Ambulatory Visit: Payer: Self-pay

## 2020-03-15 ENCOUNTER — Ambulatory Visit (INDEPENDENT_AMBULATORY_CARE_PROVIDER_SITE_OTHER): Payer: Medicaid Other | Admitting: Obstetrics and Gynecology

## 2020-03-15 VITALS — BP 131/78 | HR 82 | Ht 63.0 in | Wt 139.0 lb

## 2020-03-15 DIAGNOSIS — Z01419 Encounter for gynecological examination (general) (routine) without abnormal findings: Secondary | ICD-10-CM | POA: Insufficient documentation

## 2020-03-15 DIAGNOSIS — Z124 Encounter for screening for malignant neoplasm of cervix: Secondary | ICD-10-CM | POA: Insufficient documentation

## 2020-03-15 DIAGNOSIS — R1031 Right lower quadrant pain: Secondary | ICD-10-CM

## 2020-03-15 NOTE — Progress Notes (Signed)
  Subjective:     Debra Malone is a 30 y.o. female P2 with BMI 24 and amenorrhea associated with Mirena IUD who is here for a comprehensive physical exam. 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 Patient states the pain has been present for several weeks. She is otherwise without complaints. She is sexually active. She denies pelvic pain or abnormal discharge. Patient desires STI testing.  History reviewed. No pertinent past medical history. Past Surgical History:  Procedure Laterality Date  . NO PAST SURGERIES     History reviewed. No pertinent family history.   Social History   Socioeconomic History  . Marital status: Single    Spouse name: Not on file  . Number of children: Not on file  . Years of education: Not on file  . Highest education level: Not on file  Occupational History  . Not on file  Tobacco Use  . Smoking status: Former Games developer  . Smokeless tobacco: Never Used  . Tobacco comment: marijuana, stop with preg  Vaping Use  . Vaping Use: Never used  Substance and Sexual Activity  . Alcohol use: No  . Drug use: Yes    Types: Marijuana    Comment: 3x per week  . Sexual activity: Yes    Partners: Male    Birth control/protection: I.U.D.  Other Topics Concern  . Not on file  Social History Narrative  . Not on file   Social Determinants of Health   Financial Resource Strain: Not on file  Food Insecurity: Not on file  Transportation Needs: Not on file  Physical Activity: Not on file  Stress: Not on file  Social Connections: Not on file  Intimate Partner Violence: Not on file   Health Maintenance  Topic Date Due  . Hepatitis C Screening  Never done  . COVID-19 Vaccine (1) Never done  . INFLUENZA VACCINE  Never done  . PAP-Cervical Cytology Screening  11/09/2021  . PAP SMEAR-Modifier  11/09/2021  . TETANUS/TDAP  07/22/2027  . HIV Screening  Completed      Review of  Systems Pertinent items noted in HPI and remainder of comprehensive ROS otherwise negative.   Objective:  Blood pressure 131/78, pulse 82, height 5\' 3"  (1.6 m), weight 139 lb (63 kg).      GENERAL: Well-developed, well-nourished female in no acute distress.  HEENT: Normocephalic, atraumatic. Sclerae anicteric.  NECK: Supple. Normal thyroid.  LUNGS: Clear to auscultation bilaterally.  HEART: Regular rate and rhythm. BREASTS: Symmetric in size. No palpable masses or lymphadenopathy, skin changes, or nipple drainage. ABDOMEN: Soft, nontender, nondistended. No organomegaly. PELVIC: Normal external female genitalia. Vagina is pink and rugated.  Normal discharge. Normal appearing cervix with IUD strings extending 2 cm from os. Uterus is normal in size.  No adnexal mass or tenderness. EXTREMITIES: No cyanosis, clubbing, or edema, 2+ distal pulses.    Assessment:    Healthy female exam.      Plan:    Pap smear collected STD testing per patient request Patient referred to physical therapy Patient will be contacted with abnormal results See After Visit Summary for Counseling Recommendations

## 2020-03-15 NOTE — Progress Notes (Signed)
Patient presents for Annual Exam  LMP: spots w/IUD  Contraception: IUD  STD Screening: Desires Full Panel  Last pap:11/10/2018 WNL pt denies any Hx of abnormal pap.   Family Hx of breast cancer: none   CC: NONE

## 2020-03-16 LAB — HIV ANTIBODY (ROUTINE TESTING W REFLEX): HIV Screen 4th Generation wRfx: NONREACTIVE

## 2020-03-16 LAB — CERVICOVAGINAL ANCILLARY ONLY
Bacterial Vaginitis (gardnerella): POSITIVE — AB
Candida Glabrata: NEGATIVE
Candida Vaginitis: NEGATIVE
Chlamydia: NEGATIVE
Comment: NEGATIVE
Comment: NEGATIVE
Comment: NEGATIVE
Comment: NEGATIVE
Comment: NEGATIVE
Comment: NORMAL
Neisseria Gonorrhea: NEGATIVE
Trichomonas: NEGATIVE

## 2020-03-16 LAB — HEPATITIS C ANTIBODY: Hep C Virus Ab: <0.1 s/co ratio (ref 0.0–0.9)

## 2020-03-16 LAB — RPR: RPR Ser Ql: NONREACTIVE

## 2020-03-16 LAB — HEPATITIS B SURFACE ANTIGEN: Hepatitis B Surface Ag: NEGATIVE

## 2020-03-20 MED ORDER — METRONIDAZOLE 500 MG PO TABS: 500.0000 mg | ORAL_TABLET | Freq: Two times a day (BID) | ORAL | 0 refills | Status: DC

## 2020-03-20 NOTE — Addendum Note (Signed)
Addended by: Catalina Antigua on: 03/20/2020 02:32 PM   Modules accepted: Orders

## 2020-03-22 ENCOUNTER — Other Ambulatory Visit: Payer: Self-pay

## 2020-03-22 DIAGNOSIS — Z124 Encounter for screening for malignant neoplasm of cervix: Secondary | ICD-10-CM

## 2020-03-22 NOTE — Progress Notes (Signed)
Call from cytology requesting order.  For pap.

## 2020-03-23 LAB — CYTOLOGY - PAP: Diagnosis: NEGATIVE

## 2020-04-10 ENCOUNTER — Other Ambulatory Visit: Payer: Self-pay

## 2020-04-10 MED ORDER — FLUCONAZOLE 150 MG PO TABS: 150.0000 mg | ORAL_TABLET | Freq: Once | ORAL | 0 refills | Status: AC

## 2020-04-10 NOTE — Telephone Encounter (Signed)
Patient was recently on antibiotic and now has a yeast infection. She would like a diflucan called into her pharmacy.

## 2020-05-07 ENCOUNTER — Ambulatory Visit: Payer: Medicaid Other | Admitting: Physical Therapy

## 2020-05-30 ENCOUNTER — Other Ambulatory Visit: Payer: Self-pay

## 2020-05-30 MED ORDER — FLUCONAZOLE 150 MG PO TABS: 150.0000 mg | ORAL_TABLET | Freq: Once | ORAL | 0 refills | Status: AC

## 2020-05-30 NOTE — Telephone Encounter (Signed)
Patient is requesting medication for yeast infection.

## 2020-06-20 ENCOUNTER — Ambulatory Visit: Payer: Medicaid Other | Admitting: Physical Therapy

## 2020-08-10 ENCOUNTER — Ambulatory Visit: Payer: Medicaid Other | Attending: Obstetrics and Gynecology | Admitting: Physical Therapy

## 2020-08-10 ENCOUNTER — Other Ambulatory Visit: Payer: Self-pay

## 2020-08-10 ENCOUNTER — Encounter: Payer: Self-pay | Admitting: Physical Therapy

## 2020-08-10 DIAGNOSIS — R252 Cramp and spasm: Secondary | ICD-10-CM | POA: Diagnosis present

## 2020-08-10 DIAGNOSIS — M25651 Stiffness of right hip, not elsewhere classified: Secondary | ICD-10-CM | POA: Diagnosis present

## 2020-08-10 NOTE — Patient Instructions (Signed)

## 2020-08-11 NOTE — Therapy (Signed)
[facility] [address], [address] [place], [place], [address] Phone: [phone]   Fax:  [phone]  Physical Therapy Evaluation  Patient Details  Name: Debra Malone MRN: [id number] Date of Birth: 16-Jun-1990 Referring Provider (PT): Constant, Gigi Gin, MD (one year)   Encounter Date: 08/10/2020   PT End of Session - 08/10/20 1057     Visit Number 1    Date for PT Re-Evaluation 11/02/20    Authorization Type healthy blue medicaid    PT Start Time 1016    PT Stop Time 1047    PT Time Calculation (min) 31 min    Activity Tolerance Patient tolerated treatment well    Behavior During Therapy [facility] for tasks assessed/performed             History reviewed. No pertinent past medical history.  Past Surgical History:  Procedure Laterality Date   NO PAST SURGERIES      There were no vitals filed for this visit.    Subjective Assessment - 08/10/20 1019     Subjective Pt states she is having 8/10 when it hurts and sometimes work causes increased pain.  Pt is CNA.  Sometimes wake up with pain if waking up a certain way.  States it hurts sometimes 2-3 days per week will have pain, nothing makes it better.  Ibuprphen helps.  Have been having the pain for about a year with insidious onset    Patient Stated Goals not have pain    Currently in Pain? No/denies                [facility] PT Assessment - 08/11/20 0001       Assessment   Medical Diagnosis R10.31 (ICD-10-CM) - Groin pain, right    Referring Provider (PT) Constant, Peggy, MD   one year   Prior Therapy No      Precautions   Precautions None      Balance Screen   Has the patient fallen in the past 6 months No      Home Environment   Living Environment Private residence    Living Arrangements Children   2 children     Prior Function   Level of Independence Independent    Vocation Part time employment    Vocation Requirements lifting and bending CNA       Cognition   Overall Cognitive Status Within Functional Limits for tasks assessed      Functional Tests   Functional tests Squat;Single leg stance      Squat   Comments leaning to right side      Single Leg Stance   Comments right trendelenburg      Posture/Postural Control   Posture/Postural Control Postural limitations   anterior rotation right ilium     ROM / Strength   AROM / PROM / Strength AROM;PROM;Strength      AROM   Overall AROM Comments wfl      PROM   Overall PROM Comments hip flexion and ER 75%      Strength   Overall Strength Comments hip 5/5 except abduction on Rt side      Flexibility   Soft Tissue Assessment /Muscle Length yes    Hamstrings 75%      Special Tests    Special Tests Hip Special Tests    Other special tests ASLR - easier with stabilizing lumbar multifidi    Hip Special Tests  [employer]  Test    Findings Positive    Side Right      Ambulation/Gait   Gait Pattern Within Functional Limits                        Objective measurements completed on examination: See above findings.     Pelvic Floor Special Questions - 08/11/20 0001     Prior Pregnancies Yes    Number of Pregnancies 2    Number of Vaginal Deliveries 2    Currently Sexually Active No    Urinary Leakage No    Urinary urgency No    Fecal incontinence No    Falling out feeling (prolapse) No    External Palpation no tenderness or muscle spasms normal tone with external palpation    Exam Type Deferred                Trigger Point Dry Needling - 08/11/20 0001     Education Handout Provided Yes                    PT Short Term Goals - 08/10/20 1055       PT SHORT TERM GOAL #1   Title Pt will be ind with initial HEP    Time 4    Period Weeks    Status New    Target Date 09/07/20               PT Long Term Goals - 08/10/20 1053       PT LONG TERM GOAL #1   Title Pt will report 75% less pain in groin for  improved function at work    Time 12    Period Weeks    Status New    Target Date 11/02/20      PT LONG TERM GOAL #2   Title Pt will demonstrate good squat and lift technique for reduced muscle spasms    Time 12    Period Weeks    Status New    Target Date 11/02/20      PT LONG TERM GOAL #3   Title Pt will be ind with advanced HEP    Time 12    Period Weeks    Status New    Target Date 11/02/20                    Plan - 08/10/20 1057     Clinical Impression Statement Pt presents to clinic due to groin pain with unknown origin.  Pt has decreased flexibility of the h/s, and hip flexion, ER tight and limited ROM Rt LE more than Lt.  Pt has tight adductors, hip flexors, TFL Rt side.  Trendelenburg present on RtLE in SLS.  When squatting she demonstrates weight shift to the Lt and increased anterior rotation Rt ilium.  Slightly less hip ext with tight hip flexor in supine causing apparent Rt Leg length discrepancy. Hip abduction 4/5 on Rt side all other MMT 5/5. Pt will benefit from skilled PT to address impairments and manage pain for improved function.    Examination-Participation Restrictions Occupation    Stability/Clinical Decision Making Stable/Uncomplicated    Clinical Decision Making Low    Rehab Potential Excellent    PT Frequency 1x / week    PT Duration 12 weeks    PT Treatment/Interventions ADLs/Self Care Home Management;Cryotherapy;Electrical Stimulation;Moist Heat;Neuromuscular re-education;Therapeutic exercise;Therapeutic activities;Patient/family education;Manual techniques;Taping;Dry needling;Passive range of motion    PT Next  Visit Plan DN to Rt adductor, long axis distraction, hip flexor stretches, initial HEP, gluteal strength    Consulted and Agree with Plan of Care Patient             Patient will benefit from skilled therapeutic intervention in order to improve the following deficits and impairments:  Pain, Impaired flexibility, Hypomobility,  Decreased strength, Increased muscle spasms, Decreased range of motion, Decreased coordination  Visit Diagnosis: Cramp and spasm  Stiffness of right hip, not elsewhere classified     Problem List Patient Active Problem List   Diagnosis Date Noted   History of substance abuse (HCC) 05/04/2017   Presence of of 52 mg levonorgestrel-releasing intrauterine device (IUD) 04/14/2015    Junious Silk, PT 08/11/2020, 5:06 PM  [facility] [address], [address] [place], [place], [address] Phone: [phone]   Fax:  [phone]  Name: Debra Malone MRN: [id number] Date of Birth: [birth year]

## 2020-08-31 ENCOUNTER — Encounter: Payer: Self-pay | Admitting: Physical Therapy

## 2020-08-31 ENCOUNTER — Ambulatory Visit: Payer: Medicaid Other | Attending: Obstetrics and Gynecology | Admitting: Physical Therapy

## 2020-08-31 ENCOUNTER — Other Ambulatory Visit: Payer: Self-pay

## 2020-08-31 DIAGNOSIS — R252 Cramp and spasm: Secondary | ICD-10-CM | POA: Insufficient documentation

## 2020-08-31 DIAGNOSIS — M25651 Stiffness of right hip, not elsewhere classified: Secondary | ICD-10-CM | POA: Insufficient documentation

## 2020-08-31 NOTE — Patient Instructions (Signed)
Access Code: [email or web address] URL: [email or web address] Date: 08/31/2020 Prepared by: Dwana Curd  Exercises Squat with Chair Touch and Resistance Loop - 1 x daily - 7 x weekly - 3 sets - 10 reps Side Stepping with Resistance at Thighs - 1 x daily - 7 x weekly - 3 sets - 10 reps Sidelying Hip Abduction - 1 x daily - 7 x weekly - 3 sets - 10 reps Supine Butterfly Groin Stretch - 1 x daily - 7 x weekly - 1 sets - 3 reps - 30 sec hold Half Kneeling Hip Flexor Stretch with Sidebend - 1 x daily - 7 x weekly - 3 sets - 10 reps

## 2020-08-31 NOTE — Therapy (Signed)
[facility] [address] [place], [place], [address] Phone: [phone]   Fax:  [phone]  Physical Therapy Treatment  Patient Details  Name: Debra Malone MRN: [id number] Date of Birth: 1990/10/29 Referring Provider (PT): Catalina Antigua, MD (one year)   Encounter Date: 08/31/2020   PT End of Session - 08/31/20 0934     Visit Number 2    Date for PT Re-Evaluation 11/02/20    Authorization Type healthy blue medicaid    PT Start Time 0930    PT Stop Time 1008    PT Time Calculation (min) 38 min             History reviewed. No pertinent past medical history.  Past Surgical History:  Procedure Laterality Date   NO PAST SURGERIES      There were no vitals filed for this visit.   Subjective Assessment - 08/31/20 0932     Subjective Pt states yesterday I had to leave work because it hurt so bad    Patient Stated Goals not have pain    Currently in Pain? Yes    Pain Score 2     Pain Location Groin    Pain Orientation Mid    Pain Descriptors / Indicators Throbbing    Pain Type Chronic pain    Pain Radiating Towards can go down thigh to the knee and causing antalgic gait    Pain Onset More than a month ago    Pain Frequency Intermittent    Aggravating Factors  walking, constant movement    Pain Relieving Factors massage the leg helps a little, ibuprophen    Effect of Pain on Daily Activities work    Multiple Pain Sites No                               [facility] Adult PT Treatment/Exercise - 08/31/20 0001       Exercises   Exercises Knee/Hip      Knee/Hip Exercises: Stretches   Hip Flexor Stretch Right;Left;2 reps;30 seconds    Other Knee/Hip Stretches butterfly stretch      Knee/Hip Exercises: Standing   Other Standing Knee Exercises squat and side step with green loop - 20x      Knee/Hip Exercises: Supine   Bridges Strengthening;20 reps    Bridges Limitations green       Knee/Hip Exercises: Sidelying   Hip ABduction Strengthening;Both;10 reps      Manual Therapy   Manual Therapy Soft tissue mobilization    Soft tissue mobilization skilled palpation with dry needle and trigger point release to adductors and pectineus and hip flexor - all Rt side only              Trigger Point Dry Needling - 08/31/20 0001     Consent Given? Yes    Education Handout Provided Yes    Muscles Treated Lower Quadrant Adductor longus/brevis/magnus    Adductor Response Twitch response elicited;Palpable increased muscle length                  PT Education - 08/31/20 1009     Education Details Access Code: [email or web address]    Person(s) Educated Patient    Methods Explanation;Demonstration;Tactile cues;Verbal cues;Handout    Comprehension Returned demonstration;Verbalized understanding              PT Short Term Goals - 08/31/20 2297  PT SHORT TERM GOAL #1   Title Pt will be ind with initial HEP    Baseline given today    Status On-going               PT Long Term Goals - 08/10/20 1053       PT LONG TERM GOAL #1   Title Pt will report 75% less pain in groin for improved function at work    Time 12    Period Weeks    Status New    Target Date 11/02/20      PT LONG TERM GOAL #2   Title Pt will demonstrate good squat and lift technique for reduced muscle spasms    Time 12    Period Weeks    Status New    Target Date 11/02/20      PT LONG TERM GOAL #3   Title Pt will be ind with advanced HEP    Time 12    Period Weeks    Status New    Target Date 11/02/20                   Plan - 08/31/20 0947     Clinical Impression Statement Pt responded well to dry needling with increased soft tissue length and more even in butterfly stretch.  Pt was given initial HEP to address muscle imbalences and work on maintaining soft tissue length for pain management.    PT Treatment/Interventions ADLs/Self Care Home  Management;Cryotherapy;Electrical Stimulation;Moist Heat;Neuromuscular re-education;Therapeutic exercise;Therapeutic activities;Patient/family education;Manual techniques;Taping;Dry needling;Passive range of motion    PT Next Visit Plan f/u on DN to Rt adductor, check lumbar, long axis distraction, hip flexor stretches, initial HEP, gluteal strength progress    PT Home Exercise Plan Access Code: [email or web address]    Consulted and Agree with Plan of Care Patient             Patient will benefit from skilled therapeutic intervention in order to improve the following deficits and impairments:  Pain, Impaired flexibility, Hypomobility, Decreased strength, Increased muscle spasms, Decreased range of motion, Decreased coordination  Visit Diagnosis: Cramp and spasm  Stiffness of right hip, not elsewhere classified     Problem List Patient Active Problem List   Diagnosis Date Noted   History of substance abuse (HCC) 05/04/2017   Presence of of 52 mg levonorgestrel-releasing intrauterine device (IUD) 04/14/2015    Junious Silk, PT 08/31/2020, 10:15 AM  [facility] [address] [place], [place], [address] Phone: [phone]   Fax:  [phone]  Name: Debra Malone MRN: [id number] Date of Birth: 1990/03/18

## 2020-09-17 ENCOUNTER — Encounter: Payer: Medicaid Other | Admitting: Physical Therapy

## 2020-09-18 ENCOUNTER — Other Ambulatory Visit: Payer: Self-pay

## 2020-09-18 MED ORDER — METRONIDAZOLE 500 MG PO TABS: 500.0000 mg | ORAL_TABLET | Freq: Two times a day (BID) | ORAL | 0 refills | Status: DC

## 2020-09-24 ENCOUNTER — Other Ambulatory Visit: Payer: Self-pay

## 2020-09-24 ENCOUNTER — Ambulatory Visit: Payer: Medicaid Other | Admitting: Physical Therapy

## 2020-09-24 ENCOUNTER — Encounter: Payer: Self-pay | Admitting: Physical Therapy

## 2020-09-24 DIAGNOSIS — R252 Cramp and spasm: Secondary | ICD-10-CM

## 2020-09-24 DIAGNOSIS — M25651 Stiffness of right hip, not elsewhere classified: Secondary | ICD-10-CM

## 2020-09-24 NOTE — Patient Instructions (Signed)
Back wards walking Addition to medbridge - step down

## 2020-09-24 NOTE — Therapy (Signed)
[facility] [address], [place], [place], [address] Phone: [phone]   Fax:  [phone]  Physical Therapy Treatment  Patient Details  Name: JOSEPHINE WOOLDRIDGE MRN: [id number] Date of Birth: [birth year] Referring Provider (PT): Mora Bellman, MD (one year)   Encounter Date: 09/24/2020   PT End of Session - 09/24/20 1417     Visit Number 3    Date for PT Re-Evaluation 11/02/20    Authorization Type healthy blue medicaid    PT Start Time 1406    PT Stop Time 1445    PT Time Calculation (min) 39 min    Activity Tolerance Patient tolerated treatment well    Behavior During Therapy [facility] for tasks assessed/performed             History reviewed. No pertinent past medical history.  Past Surgical History:  Procedure Laterality Date   NO PAST SURGERIES      There were no vitals filed for this visit.   Subjective Assessment - 09/24/20 1413     Subjective I haven't had any pain since last time I was here.  I have cut back on work some.    Patient Stated Goals not have pain    Currently in Pain? No/denies                               [facility] Adult PT Treatment/Exercise - 09/24/20 0001       Knee/Hip Exercises: Standing   Step Down Right;Left;10 reps    Walking with Sports Cord 20lb 4 ways core and quad activation    Other Standing Knee Exercises side and back with blue loop      Manual Therapy   Soft tissue mobilization skilled palpation with dry needle and trigger point release to adductors and pectineus and hip flexor - all Rt side only              Trigger Point Dry Needling - 09/24/20 0001     Consent Given? Yes    Education Handout Provided Previously provided    Muscles Treated Back/Hip Lumbar multifidi    Adductor Response Twitch response elicited;Palpable increased muscle length    Lumbar multifidi Response Twitch response elicited;Palpable increased muscle length                   PT Education - 09/24/20 1636     Education Details medbridge update    Person(s) Educated Patient    Methods Explanation;Demonstration;Tactile cues;Verbal cues;Handout    Comprehension Verbalized understanding;Returned demonstration              PT Short Term Goals - 09/24/20 1418       PT SHORT TERM GOAL #1   Title Pt will be ind with initial HEP    Status Achieved               PT Long Term Goals - 09/24/20 1418       PT LONG TERM GOAL #1   Title Pt will report 75% less pain in groin for improved function at work    Status Partially Met      PT LONG TERM GOAL #2   Title Pt will demonstrate good squat and lift technique for reduced muscle spasms    Status On-going      PT LONG TERM GOAL #3   Title Pt will be ind with advanced HEP  Status On-going                   Plan - 09/24/20 1427     Clinical Impression Statement Pt was able to increase difficulty and resistence of exercises.  She has had much less pain, but is not working as many hours.  She responded well to dry needling adductors and lumbar.  Pt did well with cues to engage core and tuck tailbone during exercises.  Pt needs skilled PT to continue to address posutre and core strength    PT Treatment/Interventions ADLs/Self Care Home Management;Cryotherapy;Electrical Stimulation;Moist Heat;Neuromuscular re-education;Therapeutic exercise;Therapeutic activities;Patient/family education;Manual techniques;Taping;Dry needling;Passive range of motion    PT Next Visit Plan f/u on DN #2 to Rt adductor, and lumbar, hip flexor stretches, initial HEP, gluteal strength progress, dead lift and quadruped    PT Home Exercise Plan Access Code: [email or web address]    Consulted and Agree with Plan of Care Patient             Patient will benefit from skilled therapeutic intervention in order to improve the following deficits and impairments:  Pain, Impaired flexibility, Hypomobility,  Decreased strength, Increased muscle spasms, Decreased range of motion, Decreased coordination  Visit Diagnosis: Cramp and spasm  Stiffness of right hip, not elsewhere classified     Problem List Patient Active Problem List   Diagnosis Date Noted   History of substance abuse ([place]) 05/04/2017   Presence of of 52 mg levonorgestrel-releasing intrauterine device (IUD) 04/14/2015    Jule Ser, PT 09/24/2020, 5:06 PM  [place] [address] [place], [place], [address] Phone: [phone]   Fax:  [phone]  Name: ANGELEEN HORNEY MRN: [id number] Date of Birth: 06-10-1990

## 2020-10-01 ENCOUNTER — Encounter: Payer: Self-pay | Admitting: Physical Therapy

## 2020-10-01 ENCOUNTER — Ambulatory Visit: Payer: Medicaid Other | Attending: Obstetrics and Gynecology | Admitting: Physical Therapy

## 2020-10-01 ENCOUNTER — Other Ambulatory Visit: Payer: Self-pay

## 2020-10-01 DIAGNOSIS — R252 Cramp and spasm: Secondary | ICD-10-CM | POA: Insufficient documentation

## 2020-10-01 DIAGNOSIS — M25651 Stiffness of right hip, not elsewhere classified: Secondary | ICD-10-CM | POA: Insufficient documentation

## 2020-10-01 NOTE — Therapy (Addendum)
[facility] [address] [place], [place], [address] Phone: [phone]   Fax:  [phone]  Physical Therapy Treatment  Patient Details  Name: Debra Malone MRN: [id number] Date of Birth: [birth year] Referring Provider (PT): Mora Bellman, MD (one year)   Encounter Date: 10/01/2020   PT End of Session - 10/01/20 1238     Visit Number 4    Date for PT Re-Evaluation 11/02/20    Authorization Type healthy blue medicaid    PT Start Time 1231    PT Stop Time 1311    PT Time Calculation (min) 40 min    Activity Tolerance Patient tolerated treatment well    Behavior During Therapy [facility] for tasks assessed/performed             No past medical history on file.  Past Surgical History:  Procedure Laterality Date   NO PAST SURGERIES      There were no vitals filed for this visit.   Subjective Assessment - 10/01/20 1237     Subjective I feel great and haven't had any pain this week.  I worked less hours but this week I will pick up more hours again    Patient Stated Goals not have pain    Currently in Pain? No/denies                               [facility] Adult PT Treatment/Exercise - 10/01/20 0001       Knee/Hip Exercises: Stretches   Other Knee/Hip Stretches on foam roll - DKTC and SKTC stretches      Knee/Hip Exercises: Standing   Lateral Step Up Both;10 reps    Lateral Step Up Limitations BOSU    Forward Step Up Both;10 reps    Forward Step Up Limitations BOSU    SLS BOSU 10 sec hold, 10 x hip abdcution    Other Standing Knee Exercises dead lift bil 10 lb; single leg    Other Standing Knee Exercises dead lift technique only with cane and cues to keep back flat      Manual Therapy   Soft tissue mobilization lumbar STM                    PT Education - 10/01/20 1311     Education Details Access Code: [email or web address]    Person(s) Educated Patient    Methods  Explanation;Demonstration;Tactile cues;Verbal cues;Handout    Comprehension Verbalized understanding;Returned demonstration              PT Short Term Goals - 09/24/20 1418       PT SHORT TERM GOAL #1   Title Pt will be ind with initial HEP    Status Achieved               PT Long Term Goals - 09/24/20 1418       PT LONG TERM GOAL #1   Title Pt will report 75% less pain in groin for improved function at work    Status Partially Met      PT LONG TERM GOAL #2   Title Pt will demonstrate good squat and lift technique for reduced muscle spasms    Status On-going      PT LONG TERM GOAL #3   Title Pt will be ind with advanced HEP    Status On-going  Plan - 10/01/20 1240     Clinical Impression Statement Today's session focused on gluteal strength and hip stability for continued strength in order to return to normal hours at work. Pt is making excellent progress with pain management.  Pt will benefit from skiilled PT to continue working on strength and ensure she is able to return to full function at work    PT Treatment/Interventions ADLs/Self Care Home Management;Cryotherapy;Electrical Stimulation;Moist Heat;Neuromuscular re-education;Therapeutic exercise;Therapeutic activities;Patient/family education;Manual techniques;Taping;Dry needling;Passive range of motion    PT Next Visit Plan DN as needed for pain and muscle release             Patient will benefit from skilled therapeutic intervention in order to improve the following deficits and impairments:  Pain, Impaired flexibility, Hypomobility, Decreased strength, Increased muscle spasms, Decreased range of motion, Decreased coordination  Visit Diagnosis: Cramp and spasm  Stiffness of right hip, not elsewhere classified     Problem List Patient Active Problem List   Diagnosis Date Noted   History of substance abuse ([place]) 05/04/2017   Presence of of 52 mg levonorgestrel-releasing  intrauterine device (IUD) 04/14/2015    Jule Ser, PT 10/01/2020, 1:11 PM  [facility] [address] [place], [place], [address] Phone: [phone]   Fax:  [phone]  Name: Debra Malone MRN: [id number] Date of Birth: 10/14/1990   PHYSICAL THERAPY DISCHARGE SUMMARY  Visits from Start of Care: 4  Current functional level related to goals / functional outcomes: See above goals   Remaining deficits: See above   Education / Equipment: HEP  Patient agrees to discharge. Patient goals were met. Patient is being discharged due to not returning since the last visit.  Gustavus Bryant, PT 11/02/20 11:36 AM

## 2020-10-01 NOTE — Patient Instructions (Signed)
Access Code: [email or web address] URL: [email or web address] Date: 10/01/2020 Prepared by: Dwana Curd  Exercises Squat with Chair Touch and Resistance Loop - 1 x daily - 7 x weekly - 3 sets - 10 reps Side Stepping with Resistance at Thighs - 1 x daily - 7 x weekly - 3 sets - 10 reps Sidelying Hip Abduction - 1 x daily - 7 x weekly - 3 sets - 10 reps Supine Butterfly Groin Stretch - 1 x daily - 7 x weekly - 1 sets - 3 reps - 30 sec hold Half Kneeling Hip Flexor Stretch with Sidebend - 1 x daily - 7 x weekly - 3 sets - 10 reps Forward Step Down Touch with Heel - 1 x daily - 7 x weekly - 3 sets - 10 reps Forward T - 1 x daily - 7 x weekly - 3 sets - 10 reps Kettlebell Deadlift - 1 x daily - 7 x weekly - 3 sets - 10 reps  Patient Education Trigger Point Dry Needling

## 2020-10-08 ENCOUNTER — Encounter: Payer: Medicaid Other | Admitting: Physical Therapy

## 2020-10-19 ENCOUNTER — Ambulatory Visit: Payer: Medicaid Other | Admitting: Physical Therapy

## 2020-10-19 ENCOUNTER — Telehealth: Payer: Self-pay | Admitting: Physical Therapy

## 2020-10-19 NOTE — Telephone Encounter (Signed)
Pt called due to no show, she forgot and thought her appointment was Monday.  Pt rescheduled for monday

## 2020-10-22 ENCOUNTER — Encounter: Payer: Self-pay | Admitting: Physical Therapy

## 2020-10-26 ENCOUNTER — Ambulatory Visit: Payer: Medicaid Other | Admitting: Physical Therapy

## 2020-11-02 ENCOUNTER — Ambulatory Visit: Payer: Medicaid Other | Attending: Obstetrics and Gynecology | Admitting: Physical Therapy

## 2020-12-06 ENCOUNTER — Other Ambulatory Visit: Payer: Self-pay | Admitting: Obstetrics and Gynecology

## 2020-12-06 MED ORDER — FLUCONAZOLE 150 MG PO TABS: 150.0000 mg | ORAL_TABLET | Freq: Once | ORAL | 0 refills | Status: AC

## 2021-01-20 ENCOUNTER — Other Ambulatory Visit: Payer: Self-pay | Admitting: Obstetrics

## 2021-01-21 MED ORDER — METRONIDAZOLE 500 MG PO TABS: 500.0000 mg | ORAL_TABLET | Freq: Two times a day (BID) | ORAL | 0 refills | Status: DC

## 2021-03-04 ENCOUNTER — Encounter: Payer: Self-pay | Admitting: Obstetrics and Gynecology

## 2021-03-06 ENCOUNTER — Other Ambulatory Visit: Payer: Self-pay

## 2021-03-06 DIAGNOSIS — B379 Candidiasis, unspecified: Secondary | ICD-10-CM

## 2021-03-06 MED ORDER — FLUCONAZOLE 150 MG PO TABS: 150.0000 mg | ORAL_TABLET | Freq: Once | ORAL | 0 refills | Status: AC

## 2021-04-15 ENCOUNTER — Encounter: Payer: Self-pay | Admitting: Obstetrics and Gynecology

## 2021-04-15 ENCOUNTER — Other Ambulatory Visit: Payer: Self-pay

## 2021-04-15 ENCOUNTER — Other Ambulatory Visit (HOSPITAL_COMMUNITY)
Admission: RE | Admit: 2021-04-15 | Discharge: 2021-04-15 | Disposition: A | Discharge status: Home / Self Care | Payer: Medicaid Other | Source: Ambulatory Visit | Attending: Obstetrics and Gynecology | Admitting: Obstetrics and Gynecology

## 2021-04-15 ENCOUNTER — Ambulatory Visit (INDEPENDENT_AMBULATORY_CARE_PROVIDER_SITE_OTHER): Payer: Medicaid Other | Admitting: Obstetrics and Gynecology

## 2021-04-15 VITALS — BP 120/71 | HR 81 | Wt 131.2 lb

## 2021-04-15 DIAGNOSIS — Z113 Encounter for screening for infections with a predominantly sexual mode of transmission: Secondary | ICD-10-CM

## 2021-04-15 DIAGNOSIS — Z01411 Encounter for gynecological examination (general) (routine) with abnormal findings: Secondary | ICD-10-CM | POA: Diagnosis not present

## 2021-04-15 DIAGNOSIS — N761 Subacute and chronic vaginitis: Secondary | ICD-10-CM | POA: Diagnosis not present

## 2021-04-15 MED ORDER — BORIC ACID CRYS: 600.0000 mg | CRYSTALS | Freq: Every day | 5 refills | Status: DC

## 2021-04-15 NOTE — Progress Notes (Signed)
Subjective:     Debra Malone is a 31 y.o. female P2 with Mirena IUD induced amenorrhea who is here for a comprehensive physical exam. The patient reports recurrent BV and yeast infections. She denies any current symptoms but desires to be tested. She is sexually active with the same partner of 9 years. She denies pelvic pain or abnormal discharge. Patient is without any other complaints  No past medical history on file. Past Surgical History:  Procedure Laterality Date   NO PAST SURGERIES     No family history on file.   Social History   Socioeconomic History   Marital status: Single    Spouse name: Not on file   Number of children: Not on file   Years of education: Not on file   Highest education level: Not on file  Occupational History   Not on file  Tobacco Use   Smoking status: Former   Smokeless tobacco: Never   Tobacco comments:    marijuana, stop with preg  Vaping Use   Vaping Use: Never used  Substance and Sexual Activity   Alcohol use: Yes    Comment: social    Drug use: Yes    Types: Marijuana    Comment: 3x per week   Sexual activity: Yes    Partners: Male    Birth control/protection: I.U.D.  Other Topics Concern   Not on file  Social History Narrative   Not on file   Social Determinants of Health   Financial Resource Strain: Not on file  Food Insecurity: Not on file  Transportation Needs: Not on file  Physical Activity: Not on file  Stress: Not on file  Social Connections: Not on file  Intimate Partner Violence: Not on file   Health Maintenance  Topic Date Due   COVID-19 Vaccine (1) Never done   INFLUENZA VACCINE  Never done   PAP-Cervical Cytology Screening  03/23/2023   PAP SMEAR-Modifier  03/23/2023   TETANUS/TDAP  07/22/2027   Hepatitis C Screening  Completed   HIV Screening  Completed   HPV VACCINES  Aged Out       Review of Systems Pertinent items noted in HPI and remainder of comprehensive ROS otherwise negative.    Objective:  Blood pressure 120/71, pulse 81, weight 131 lb 3.2 oz (59.5 kg).   GENERAL: Well-developed, well-nourished female in no acute distress.  HEENT: Normocephalic, atraumatic. Sclerae anicteric.  NECK: Supple. Normal thyroid.  LUNGS: Clear to auscultation bilaterally.  HEART: Regular rate and rhythm. BREASTS: Symmetric in size. No palpable masses or lymphadenopathy, skin changes, or nipple drainage. ABDOMEN: Soft, nontender, nondistended. No organomegaly. PELVIC: Normal external female genitalia. Vagina is pink and rugated.  Normal discharge. Normal appearing cervix. Uterus is normal in size. No adnexal mass or tenderness. Chaperone present during the pelvic exam EXTREMITIES: No cyanosis, clubbing, or edema, 2+ distal pulses.     Assessment:    Healthy female exam.      Plan:    Pap smear collected STI screening per patient request Patient will be contacted with abnormal results Rx boric acid provided See After Visit Summary for Counseling Recommendations

## 2021-04-15 NOTE — Progress Notes (Signed)
Pt in office today for annual visit. Pt c/o reoccurring yeast infections and feels like she may have one now. She is requesting a PAP and STD testing. PHQ2=0

## 2021-04-16 ENCOUNTER — Other Ambulatory Visit: Payer: Self-pay | Admitting: Obstetrics and Gynecology

## 2021-04-16 LAB — HIV ANTIBODY (ROUTINE TESTING W REFLEX): HIV Screen 4th Generation wRfx: NONREACTIVE

## 2021-04-16 LAB — CERVICOVAGINAL ANCILLARY ONLY
Bacterial Vaginitis (gardnerella): POSITIVE — AB
Candida Glabrata: NEGATIVE
Candida Vaginitis: NEGATIVE
Chlamydia: NEGATIVE
Comment: NEGATIVE
Comment: NEGATIVE
Comment: NEGATIVE
Comment: NEGATIVE
Comment: NEGATIVE
Comment: NORMAL
Neisseria Gonorrhea: NEGATIVE
Trichomonas: NEGATIVE

## 2021-04-16 LAB — HEPATITIS C ANTIBODY: Hep C Virus Ab: NONREACTIVE

## 2021-04-16 LAB — RPR: RPR Ser Ql: NONREACTIVE

## 2021-04-16 LAB — HEPATITIS B SURFACE ANTIGEN: Hepatitis B Surface Ag: NEGATIVE

## 2021-04-16 MED ORDER — METRONIDAZOLE 500 MG PO TABS: 500.0000 mg | ORAL_TABLET | Freq: Two times a day (BID) | ORAL | 0 refills | Status: DC

## 2021-04-17 ENCOUNTER — Encounter: Payer: Self-pay | Admitting: Obstetrics and Gynecology

## 2021-04-18 LAB — CYTOLOGY - PAP
Diagnosis: NEGATIVE
Diagnosis: REACTIVE

## 2022-04-28 ENCOUNTER — Ambulatory Visit: Payer: Medicaid Other | Admitting: Obstetrics and Gynecology

## 2022-04-28 ENCOUNTER — Ambulatory Visit: Payer: Medicaid Other | Admitting: Medical

## 2022-06-09 ENCOUNTER — Ambulatory Visit (INDEPENDENT_AMBULATORY_CARE_PROVIDER_SITE_OTHER): Payer: Medicaid Other | Admitting: Obstetrics and Gynecology

## 2022-06-09 VITALS — BP 127/78 | HR 83 | Wt 151.0 lb

## 2022-06-09 DIAGNOSIS — B9689 Other specified bacterial agents as the cause of diseases classified elsewhere: Secondary | ICD-10-CM

## 2022-06-09 DIAGNOSIS — Z1339 Encounter for screening examination for other mental health and behavioral disorders: Secondary | ICD-10-CM

## 2022-06-09 DIAGNOSIS — N760 Acute vaginitis: Secondary | ICD-10-CM | POA: Diagnosis not present

## 2022-06-09 DIAGNOSIS — Z30431 Encounter for routine checking of intrauterine contraceptive device: Secondary | ICD-10-CM

## 2022-06-09 MED ORDER — METRONIDAZOLE 0.75 % VA GEL: 1.0000 | VAGINAL | 0 refills | Status: AC

## 2022-06-09 NOTE — Progress Notes (Signed)
   RETURN GYNECOLOGY VISIT  Subjective:  Debra Malone is a 32 y.o. [phone] with Mirena IUD in place (08/31/17) presenting for IUD check up.   Initially presented for IUD removal. Was counseled on approval for 8 years for contraception, so she decided she wanted to keep it in place. Requested string check.   Also reports recurrent BV infections. Has tried boric acid without success.   Objective:   Vitals:   06/09/22 1032  BP: 127/78  Pulse: 83  Weight: 151 lb (68.5 kg)    General:  Alert, oriented and cooperative. Patient is in no acute distress.  Skin: Skin is warm and dry. No rash noted.   Cardiovascular: Normal heart rate noted  Respiratory: Normal respiratory effort, no problems with respiration noted  Abdomen: Soft, non-tender, non-distended   Pelvic: NEFG. Anterior lip of cervix visualized and normal. Uterus and cervix normal texture, size & mobility. Strings palpable w/ normal length.  Exam performed in the presence of a chaperone  Assessment and Plan:  Debra Malone is a 32 y.o. presenting for IUD check and recurrent BV  IUD check - Normal exam today  2. Recurrent BV - Discussed episodic vs extended therapy for recurrent BV. Reviewed that extended therapy can prevent BV but that many women will have recurrence of frequent BV after stopping treatment. She would like to trial extended therapy. -     metroNIDAZOLE (METROGEL) 0.75 % vaginal gel; Place 1 Applicatorful vaginally 2 (two) times a week for 48 doses. Apply one applicatorful to vagina at bedtime twice weekly  Return in about 6 months (around 12/09/2022) for return gyn.- to discuss BV suppression  Lennart Pall, MD

## 2022-06-09 NOTE — Patient Instructions (Signed)
Place 1 applicatorfull of gel into the vagina twice a week at night.  Please follow up in 6 months to see how the gel is working for you

## 2024-05-19 ENCOUNTER — Ambulatory Visit: Admitting: Obstetrics and Gynecology
